# Patient Record
Sex: Male | Born: 1955 | Race: White | Hispanic: No | Smoking: Current every day smoker
Health system: Southern US, Community
[De-identification: ages and names within clinical notes are randomized; demographics above are authoritative.]

## PROBLEM LIST (undated history)

## (undated) DIAGNOSIS — F1910 Other psychoactive substance abuse, uncomplicated: Secondary | ICD-10-CM

## (undated) DIAGNOSIS — F1010 Alcohol abuse, uncomplicated: Secondary | ICD-10-CM

---

## 2004-08-16 ENCOUNTER — Emergency Department (HOSPITAL_COMMUNITY): Admission: EM | Admit: 2004-08-16 | Discharge: 2004-08-17 | Payer: Self-pay | Admitting: Emergency Medicine

## 2008-06-07 DIAGNOSIS — B182 Chronic viral hepatitis C: Secondary | ICD-10-CM | POA: Insufficient documentation

## 2012-08-24 ENCOUNTER — Emergency Department (HOSPITAL_COMMUNITY): Payer: Managed Care, Other (non HMO)

## 2012-08-24 ENCOUNTER — Encounter (HOSPITAL_COMMUNITY): Payer: Self-pay | Admitting: *Deleted

## 2012-08-24 ENCOUNTER — Emergency Department (HOSPITAL_COMMUNITY)
Admission: EM | Admit: 2012-08-24 | Discharge: 2012-08-25 | Disposition: A | Discharge status: Home / Self Care | Payer: Managed Care, Other (non HMO) | Attending: Emergency Medicine | Admitting: Emergency Medicine

## 2012-08-24 DIAGNOSIS — R1909 Other intra-abdominal and pelvic swelling, mass and lump: Secondary | ICD-10-CM

## 2012-08-24 DIAGNOSIS — M549 Dorsalgia, unspecified: Secondary | ICD-10-CM | POA: Insufficient documentation

## 2012-08-24 DIAGNOSIS — M1990 Unspecified osteoarthritis, unspecified site: Secondary | ICD-10-CM | POA: Insufficient documentation

## 2012-08-24 DIAGNOSIS — M519 Unspecified thoracic, thoracolumbar and lumbosacral intervertebral disc disorder: Secondary | ICD-10-CM | POA: Insufficient documentation

## 2012-08-24 DIAGNOSIS — M542 Cervicalgia: Secondary | ICD-10-CM | POA: Insufficient documentation

## 2012-08-24 DIAGNOSIS — F1910 Other psychoactive substance abuse, uncomplicated: Secondary | ICD-10-CM | POA: Insufficient documentation

## 2012-08-24 DIAGNOSIS — R1900 Intra-abdominal and pelvic swelling, mass and lump, unspecified site: Secondary | ICD-10-CM | POA: Insufficient documentation

## 2012-08-24 HISTORY — DX: Other psychoactive substance abuse, uncomplicated: F19.10

## 2012-08-24 HISTORY — DX: Alcohol abuse, uncomplicated: F10.10

## 2012-08-24 LAB — RAPID URINE DRUG SCREEN, HOSP PERFORMED
Amphetamines: NOT DETECTED
Barbiturates: NOT DETECTED
Benzodiazepines: NOT DETECTED
Cocaine: NOT DETECTED
Opiates: NOT DETECTED
Tetrahydrocannabinol: NOT DETECTED

## 2012-08-24 LAB — COMPREHENSIVE METABOLIC PANEL
ALT: 10 U/L (ref 0–53)
AST: 15 U/L (ref 0–37)
Albumin: 4 g/dL (ref 3.5–5.2)
Alkaline Phosphatase: 62 U/L (ref 39–117)
BUN: 8 mg/dL (ref 6–23)
CO2: 27 mEq/L (ref 19–32)
Calcium: 9.6 mg/dL (ref 8.4–10.5)
Chloride: 105 mEq/L (ref 96–112)
Creatinine, Ser: 0.77 mg/dL (ref 0.50–1.35)
GFR calc Af Amer: >90 mL/min (ref >90)
GFR calc non Af Amer: >90 mL/min (ref >90)
Glucose, Bld: 106 mg/dL — ABNORMAL HIGH (ref 70–99)
Potassium: 3.8 mEq/L (ref 3.5–5.1)
Sodium: 140 mEq/L (ref 135–145)
Total Bilirubin: 0.2 mg/dL — ABNORMAL LOW (ref 0.3–1.2)
Total Protein: 7 g/dL (ref 6.0–8.3)

## 2012-08-24 LAB — CBC WITH DIFFERENTIAL/PLATELET
Basophils Absolute: 0 10*3/uL (ref 0.0–0.1)
Basophils Relative: 0 % (ref 0–1)
Eosinophils Absolute: 0.1 10*3/uL (ref 0.0–0.7)
Eosinophils Relative: 1 % (ref 0–5)
HCT: 42.2 % (ref 39.0–52.0)
Hemoglobin: 14.6 g/dL (ref 13.0–17.0)
Lymphocytes Relative: 32 % (ref 12–46)
Lymphs Abs: 2.3 10*3/uL (ref 0.7–4.0)
MCH: 32.6 pg (ref 26.0–34.0)
MCHC: 34.6 g/dL (ref 30.0–36.0)
MCV: 94.2 fL (ref 78.0–100.0)
Monocytes Absolute: 0.5 10*3/uL (ref 0.1–1.0)
Monocytes Relative: 8 % (ref 3–12)
Neutro Abs: 4.2 10*3/uL (ref 1.7–7.7)
Neutrophils Relative %: 60 % (ref 43–77)
Platelets: 245 10*3/uL (ref 150–400)
RBC: 4.48 MIL/uL (ref 4.22–5.81)
RDW: 12.5 % (ref 11.5–15.5)
WBC: 7 10*3/uL (ref 4.0–10.5)

## 2012-08-24 LAB — URINALYSIS, ROUTINE W REFLEX MICROSCOPIC
Bilirubin Urine: NEGATIVE
Glucose, UA: NEGATIVE mg/dL
Hgb urine dipstick: NEGATIVE
Ketones, ur: NEGATIVE mg/dL
Leukocytes, UA: NEGATIVE
Nitrite: NEGATIVE
Protein, ur: NEGATIVE mg/dL
Specific Gravity, Urine: 1.018 (ref 1.005–1.030)
Urobilinogen, UA: 0.2 mg/dL (ref 0.0–1.0)
pH: 6 (ref 5.0–8.0)

## 2012-08-24 LAB — ETHANOL: Alcohol, Ethyl (B): <11 mg/dL (ref 0–11)

## 2012-08-24 MED ORDER — ONDANSETRON HCL 8 MG PO TABS
4.0000 mg | ORAL_TABLET | Freq: Three times a day (TID) | ORAL | Status: DC | PRN
  Administered 2012-08-24: 4 mg via ORAL
  Filled 2012-08-24: qty 1

## 2012-08-24 MED ORDER — LORAZEPAM 1 MG PO TABS: 1.0000 mg | ORAL_TABLET | Freq: Four times a day (QID) | ORAL | Status: DC | PRN

## 2012-08-24 MED ORDER — FOLIC ACID 1 MG PO TABS
1.0000 mg | ORAL_TABLET | Freq: Every day | ORAL | Status: DC
  Administered 2012-08-24: 1 mg via ORAL
  Filled 2012-08-24: qty 1

## 2012-08-24 MED ORDER — ADULT MULTIVITAMIN W/MINERALS CH
1.0000 | ORAL_TABLET | Freq: Every day | ORAL | Status: DC
  Administered 2012-08-24: 1 via ORAL
  Filled 2012-08-24: qty 1

## 2012-08-24 MED ORDER — IOHEXOL 300 MG/ML  SOLN
80.0000 mL | Freq: Once | INTRAMUSCULAR | Status: AC | PRN
  Administered 2012-08-24: 80 mL via INTRAVENOUS

## 2012-08-24 MED ORDER — THIAMINE HCL 100 MG/ML IJ SOLN: 100.0000 mg | Freq: Every day | INTRAMUSCULAR | Status: DC

## 2012-08-24 MED ORDER — ZOLPIDEM TARTRATE 5 MG PO TABS
5.0000 mg | ORAL_TABLET | Freq: Every evening | ORAL | Status: DC | PRN
  Administered 2012-08-24: 5 mg via ORAL
  Filled 2012-08-24: qty 1

## 2012-08-24 MED ORDER — LORAZEPAM 1 MG PO TABS
1.0000 mg | ORAL_TABLET | Freq: Three times a day (TID) | ORAL | Status: DC | PRN
  Administered 2012-08-24: 1 mg via ORAL
  Filled 2012-08-24: qty 1

## 2012-08-24 MED ORDER — VITAMIN B-1 100 MG PO TABS
100.0000 mg | ORAL_TABLET | Freq: Every day | ORAL | Status: DC
  Administered 2012-08-24: 100 mg via ORAL
  Filled 2012-08-24: qty 1

## 2012-08-24 MED ORDER — NICOTINE 21 MG/24HR TD PT24
21.0000 mg | MEDICATED_PATCH | Freq: Every day | TRANSDERMAL | Status: DC
  Administered 2012-08-24: 21 mg via TRANSDERMAL
  Filled 2012-08-24 (×2): qty 1

## 2012-08-24 MED ORDER — LORAZEPAM 2 MG/ML IJ SOLN: 1.0000 mg | Freq: Four times a day (QID) | INTRAMUSCULAR | Status: DC | PRN

## 2012-08-24 NOTE — ED Notes (Signed)
He is here for detox.  He last took percocet this am 3omg. He is also an alcoholic.  He wants back and neck pain help for his pain.  He al;so has a growth beside his testicle for one year.  Last alcohol last pm

## 2012-08-24 NOTE — BH Assessment (Addendum)
Assessment Note Pt was declined at [facility] by Donell Sievert due to lack of criteria for inpatient treatment and was encouraged to pursue outpatient treatment.  Will explore outpatient options with patient.    Derrick Warner is an 56 y.o. male who presents to [facility] for alcohol detox.  He reports he used to drink a lot of liquor, but was able to reduce his usage and started drinking beer.  In the last six months, his beer usage has increased to 6-8 beers daily.  He also reports that he felt a sharp pain in his back about a year ago which he was managing with tylenol and ibuprofen until his stool turned very dark and he discontinued them in exchange for oxycodone which he has been obtaining off the street.  He has never seen a doctor for this pain in his back.  He also reports that he has withdrawal symptoms when he is not taking the opiates, but still has significant pain in his back.  He denies any SI, HI, or psychosis, but does report some depression.    Axis I: Mood Disorder NOS and Alcohol Dependence Opioid Dependence Axis II: Deferred Axis III:  Past Medical History  Diagnosis Date  . Alcohol abuse   . Drug abuse    Axis IV: problems with access to health care services and problems with primary support group Axis V: 41-50 serious symptoms  Past Medical History:  Past Medical History  Diagnosis Date  . Alcohol abuse   . Drug abuse     History reviewed. No pertinent past surgical history.  Family History: No family history on file.  Social History:  reports that he has been smoking.  He does not have any smokeless tobacco history on file. He reports that he drinks alcohol. His drug history not on file.  Additional Social History:  Alcohol / Drug Use History of alcohol / drug use?: Yes Substance #1 Name of Substance 1: Beer 1 - Age of First Use: 17 1 - Amount (size/oz): 6-8  1 - Frequency: daily 1 - Duration: 6 mos 1 - Last Use / Amount: 08/24/12 0100 8 beers Substance #2 Name of  Substance 2: Oxycodone 2 - Age of First Use: 52 2 - Amount (size/oz): 75 2 - Frequency: daily 2 - Duration: 1 year 2 - Last Use / Amount: 08/24/12 20mg   CIWA: CIWA-Ar BP: 132/84 mmHg Pulse Rate: 54  Nausea and Vomiting: no nausea and no vomiting Tactile Disturbances: none Tremor: no tremor Auditory Disturbances: not present Paroxysmal Sweats: no sweat visible Visual Disturbances: not present Anxiety: no anxiety, at ease Headache, Fullness in Head: none present Agitation: normal activity Orientation and Clouding of Sensorium: oriented and can do serial additions CIWA-Ar Total: 0  COWS: Clinical Opiate Withdrawal Scale (COWS) Resting Pulse Rate: Pulse Rate 80 or below Sweating: No report of chills or flushing Restlessness: Able to sit still Pupil Size: Pupils possibly larger than normal for room light Bone or Joint Aches: Mild diffuse discomfort Runny Nose or Tearing: Not present GI Upset: No GI symptoms Tremor: No tremor Yawning: No yawning Anxiety or Irritability: None Gooseflesh Skin: Skin is smooth COWS Total Score: 2   Allergies: No Known Allergies  Home Medications:  (Not in a hospital admission)  OB/GYN Status:  No LMP for male patient.  General Assessment Data Location of Assessment: [facility] ED Living Arrangements: Other relatives (sister and brother in-law) Can pt return to current living arrangement?: Yes Admission Status: Voluntary Is patient capable of  signing voluntary admission?: Yes Transfer from: Acute Hospital Referral Source: Self/Family/Friend  Education Status Is patient currently in school?: No Highest grade of school patient has completed: College  Risk to self Suicidal Ideation: No-Not Currently/Within Last 6 Months Suicidal Intent: No Is patient at risk for suicide?: No Suicidal Plan?: No Access to Means: No What has been your use of drugs/alcohol within the last 12 months?: alcohol, opiates Previous Attempts/Gestures: No Intentional Self  Injurious Behavior: None Family Suicide History: No Recent stressful life event(s): Turmoil (Comment) (oldest son having physical and financial problems) Persecutory voices/beliefs?: No Depression: Yes Depression Symptoms: Guilt;Tearfulness;Isolating;Loss of interest in usual pleasures;Feeling worthless/self pity;Feeling angry/irritable;Fatigue;Insomnia Substance abuse history and/or treatment for substance abuse?: Yes Suicide prevention information given to non-admitted patients: Yes  Risk to Others Homicidal Ideation: No Thoughts of Harm to Others: No-Not Currently Present/Within Last 6 Months Current Homicidal Intent: No Current Homicidal Plan: No Access to Homicidal Means: No Identified Victim: no one in particular-gets angry in situations History of harm to others?: Yes Assessment of Violence: In distant past Violent Behavior Description: fighting while drinking Does patient have access to weapons?: No Criminal Charges Pending?: No Does patient have a court date: No  Psychosis Hallucinations: None noted Delusions: None noted  Mental Status Report Appear/Hygiene: Improved Eye Contact: Good Motor Activity: Freedom of movement Speech: Logical/coherent Level of Consciousness: Alert Mood: Depressed Affect: Appropriate to circumstance Anxiety Level: Minimal Thought Processes: Coherent;Relevant Judgement: Unimpaired Orientation: Person;Place;Time;Situation Obsessive Compulsive Thoughts/Behaviors: None  Cognitive Functioning Concentration: Decreased Memory: Recent Intact;Remote Intact IQ: Average Insight: Good Impulse Control: Fair Appetite: Fair Sleep: Decreased Total Hours of Sleep:  (very restless, wakeful) Vegetative Symptoms: None  ADLScreening [facility] Assessment Services) Patient's cognitive ability adequate to safely complete daily activities?: Yes Patient able to express need for assistance with ADLs?: Yes Independently performs ADLs?: Yes (appropriate for  developmental age)  Abuse/Neglect [facility]) Physical Abuse: Denies Verbal Abuse: Denies Sexual Abuse: Denies  Prior Inpatient Therapy Prior Inpatient Therapy: No  Prior Outpatient Therapy Prior Outpatient Therapy: Yes Prior Therapy Dates: 2008 Prior Therapy Facilty/Provider(s): [facility] Reason for Treatment: drinking liquor  ADL Screening (condition at time of admission) Patient's cognitive ability adequate to safely complete daily activities?: Yes Patient able to express need for assistance with ADLs?: Yes Independently performs ADLs?: Yes (appropriate for developmental age) Weakness of Legs: None Weakness of Arms/Hands: None       Abuse/Neglect Assessment (Assessment to be complete while patient is alone) Physical Abuse: Denies Verbal Abuse: Denies Sexual Abuse: Denies Exploitation of patient/patient's resources: Denies Self-Neglect: Denies     Merchant navy officer (For Healthcare) Advance Directive: Patient does not have advance directive Pre-existing out of facility DNR order (yellow form or pink MOST form): No Nutrition Screen- [facility] Adult/WL/AP Patient's home diet: Regular  Additional Information 1:1 In Past 12 Months?: No CIRT Risk: No Elopement Risk: No Does patient have medical clearance?: Yes     Disposition:  Disposition Disposition of Patient: Inpatient treatment program Type of inpatient treatment program: Adult  On Site Evaluation by:   Reviewed with Physician:     Steward Ros 08/24/2012 10:52 PM

## 2012-08-24 NOTE — ED Provider Notes (Signed)
History  This chart was scribed for Derrick Quarry, MD by Ladona Ridgel Day. This patient was seen in room TR10C/TR10C and the patient's care was started at 1504.   CSN: [id number]  Arrival date & time 08/24/12  1504   None     Chief Complaint  Patient presents with  . detox alcohol and drugs    Patient is a 56 y.o. male presenting with drug/alcohol assessment. The history is provided by the patient. No language interpreter was used.  Drug / Alcohol Assessment Primary symptoms include no loss of consciousness, no weakness and no self-injury. This is a chronic problem. The current episode started more than 1 week ago. The problem has not changed since onset.Suspected agents include alcohol and prescription drugs. Pertinent negatives include no fever, no nausea and no vomiting.   Derrick Warner is a 56 y.o. male who presents to the Emergency Department requesting detox. He states that he last took percocet about 12 hours ago and also struggling with alcoholism. He states his back and neck pain are also bothering him. He states has been abusing 75 mg oxycodone a day and 6-12 beers a day almost all of his life. He states has tried to quit alcohol before and had tremors from withdrawal. 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 He also complains of a mass on his right groin that has increased in size over the years.   Past Medical History  Diagnosis Date  . Alcohol abuse   . Drug abuse     History reviewed. No pertinent past surgical history.  No family history on file.  History  Substance Use Topics  . Smoking status: Current Everyday Smoker  . Smokeless tobacco: Not on file  . Alcohol Use: Yes      Review of Systems  Constitutional: Negative for fever and chills.  HENT: Positive for neck pain. Negative for congestion and rhinorrhea.   Respiratory: Negative for shortness of breath.   Gastrointestinal: Negative for nausea, vomiting and abdominal pain.  Musculoskeletal: Positive  for back pain.  Skin: Negative for color change.  Neurological: Negative for loss of consciousness and weakness.  Psychiatric/Behavioral: Negative for self-injury.       Abusing oxycodone and alcohol  All other systems reviewed and are negative.    Allergies  Review of patient's allergies indicates no known allergies.  Home Medications  No current outpatient prescriptions on file.  Triage Vitals: BP 139/83  Pulse 68  Temp 98.2 F (36.8 C) (Oral)  Resp 18  SpO2 100%  Physical Exam  Nursing note and vitals reviewed. Constitutional: He is oriented to person, place, and time. He appears well-developed and well-nourished. No distress.  HENT:  Head: Normocephalic and atraumatic.  Eyes: EOM are normal.  Neck: Neck supple. No tracheal deviation present.  Cardiovascular: Normal rate, regular rhythm and normal heart sounds.   Pulmonary/Chest: Effort normal. No respiratory distress.  Abdominal: Soft. Bowel sounds are normal. He exhibits no distension. There is no tenderness. There is no rebound and no guarding.  Genitourinary:       Non tender 4 x 10 cm mass right medial inguinal which is non-reducible.   Musculoskeletal: Normal range of motion.  Neurological: He is alert and oriented to person, place, and time.  Skin: Skin is warm and dry.  Psychiatric: He has a normal mood and affect. His behavior is normal.    ED Course  Procedures (including critical care time) DIAGNOSTIC STUDIES: Oxygen Saturation is 100% on room  air, normal by my interpretation.    COORDINATION OF CARE: At 500 PM Discussed treatment plan with patient which includes UA, blood work, and ETOH screen. Patient agrees.   Labs Reviewed  COMPREHENSIVE METABOLIC PANEL - Abnormal; Notable for the following:    Glucose, Bld 106 (*)     Total Bilirubin 0.2 (*)     All other components within normal limits  URINALYSIS, ROUTINE W REFLEX MICROSCOPIC  URINE RAPID DRUG SCREEN (HOSP PERFORMED)  CBC WITH DIFFERENTIAL    ETHANOL   No results found.   No diagnosis found. Dg Tibia/fibula Left  08/24/2012  *RADIOLOGY REPORT*  Clinical Data: Painful lump  LEFT TIBIA AND FIBULA - 2 VIEW  Comparison: None.  Findings: No acute fracture.  No dislocation.  Soft tissue swelling lateral to the head of the fibula is noted.  IMPRESSION: No acute bony pathology.  Soft tissue swelling over the fibular head is noted.   Original Report Authenticated By: Donavan Burnet, M.D.    Ct Pelvis W Contrast  08/24/2012  *RADIOLOGY REPORT*  Clinical Data:  Right inguinal mass.  CT PELVIS WITH CONTRAST  Technique:  Multidetector CT imaging of the pelvis was performed using the standard protocol following the bolus administration of intravenous contrast.  Contrast: 80mL OMNIPAQUE IOHEXOL 300 MG/ML  SOLN  Comparison:   None.  Findings:  A well circumscribed fatty mass measures 2.3 x 3.7 x 5.4 cm.  This is external to the inguinal canal and compatible with a benign lipoma.  The visualized kidneys and ureters are within normal limits. Urinary bladder is unremarkable.  The visualized bowel is within normal limits.  The appendix is visualized and normal.  No significant adenopathy or free fluid is present.  Chronic end plate degenerative changes are present on the right at L4-5.  A rightward disc herniation and osseous foraminal stenosis are present at L4-5 as well.  IMPRESSION:  1.  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 2.  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   Original Report Authenticated By: Jamesetta Orleans. MATTERN, M.D.      MDM     I personally performed the services described in this documentation, which was scribed in my presence. The recorded information has been reviewed and considered.   1- polysubstance abuse.  Patient to be evaluated by act 2- groin mass- c.w. Lipoma- advised follow up with surgeon for further evaluation after discharge.    Derrick Quarry, MD 08/24/12 [phone]

## 2012-08-25 NOTE — BHH Counselor (Signed)
Oneita Hurt, assessment counselor at [facility], submitted Pt for admission to [facility]. Akeysha McMurren, AC confirmed there is not an appropriate bed available at this time. Donell Sievert, PA reviewed Pt's clinical information and determined Pt does not meet inpatient criteria and recommends referrals to outpatient treatment. Communicated this recommendation to [facility].  Harlin Rain Patsy [place], LPC

## 2012-08-25 NOTE — ED Notes (Addendum)
Explained to pt policy for belongings/ verified pt belongings with Burna Forts EMT/ Stored pt wallet, black LG flip phone and cash $100 with security in secured location/ placed pt other belongings in locker 7 and red/black duffle is on top of the grey lockers.

## 2012-08-25 NOTE — ED Notes (Signed)
Provided pt with medical records number per pt request.

## 2018-11-11 ENCOUNTER — Ambulatory Visit
Admission: RE | Admit: 2018-11-11 | Discharge: 2018-11-11 | Disposition: A | Discharge status: Home / Self Care | Payer: Disability Insurance | Source: Ambulatory Visit | Attending: Family Medicine | Admitting: Family Medicine

## 2018-11-25 ENCOUNTER — Ambulatory Visit
Admission: RE | Admit: 2018-11-25 | Discharge: 2018-11-25 | Disposition: A | Discharge status: Home / Self Care | Payer: Disability Insurance | Source: Ambulatory Visit | Attending: *Deleted | Admitting: *Deleted

## 2018-11-25 ENCOUNTER — Other Ambulatory Visit: Payer: Self-pay | Admitting: *Deleted

## 2018-11-25 ENCOUNTER — Ambulatory Visit
Admission: RE | Admit: 2018-11-25 | Discharge: 2018-11-25 | Disposition: A | Discharge status: Home / Self Care | Payer: Disability Insurance | Source: Ambulatory Visit | Attending: Pediatrics | Admitting: Pediatrics

## 2018-11-25 DIAGNOSIS — M79601 Pain in right arm: Secondary | ICD-10-CM

## 2018-11-25 DIAGNOSIS — M549 Dorsalgia, unspecified: Secondary | ICD-10-CM

## 2020-01-27 ENCOUNTER — Telehealth: Payer: Self-pay | Admitting: *Deleted

## 2020-01-30 NOTE — Telephone Encounter (Signed)
Error note

## 2020-11-05 DIAGNOSIS — Z125 Encounter for screening for malignant neoplasm of prostate: Secondary | ICD-10-CM | POA: Diagnosis not present

## 2020-11-05 DIAGNOSIS — Z0000 Encounter for general adult medical examination without abnormal findings: Secondary | ICD-10-CM | POA: Diagnosis not present

## 2020-11-05 DIAGNOSIS — Z1211 Encounter for screening for malignant neoplasm of colon: Secondary | ICD-10-CM | POA: Diagnosis not present

## 2020-11-05 DIAGNOSIS — Z720 Tobacco use: Secondary | ICD-10-CM | POA: Diagnosis not present

## 2020-11-05 DIAGNOSIS — R0600 Dyspnea, unspecified: Secondary | ICD-10-CM | POA: Diagnosis not present

## 2020-12-20 DIAGNOSIS — Z720 Tobacco use: Secondary | ICD-10-CM | POA: Diagnosis not present

## 2020-12-20 DIAGNOSIS — Z1211 Encounter for screening for malignant neoplasm of colon: Secondary | ICD-10-CM | POA: Diagnosis not present

## 2020-12-20 DIAGNOSIS — E782 Mixed hyperlipidemia: Secondary | ICD-10-CM | POA: Diagnosis not present

## 2020-12-20 DIAGNOSIS — E118 Type 2 diabetes mellitus with unspecified complications: Secondary | ICD-10-CM | POA: Diagnosis not present

## 2021-02-27 ENCOUNTER — Other Ambulatory Visit: Payer: Self-pay | Admitting: Gastroenterology

## 2021-02-27 DIAGNOSIS — F1011 Alcohol abuse, in remission: Secondary | ICD-10-CM | POA: Diagnosis not present

## 2021-02-27 DIAGNOSIS — Z8619 Personal history of other infectious and parasitic diseases: Secondary | ICD-10-CM | POA: Diagnosis not present

## 2021-02-27 DIAGNOSIS — Z1211 Encounter for screening for malignant neoplasm of colon: Secondary | ICD-10-CM | POA: Diagnosis not present

## 2021-03-01 ENCOUNTER — Other Ambulatory Visit: Payer: Disability Insurance

## 2021-03-20 ENCOUNTER — Other Ambulatory Visit: Payer: Self-pay | Admitting: Gastroenterology

## 2021-03-20 DIAGNOSIS — Z8619 Personal history of other infectious and parasitic diseases: Secondary | ICD-10-CM

## 2021-03-20 DIAGNOSIS — F1011 Alcohol abuse, in remission: Secondary | ICD-10-CM

## 2021-03-22 DIAGNOSIS — E118 Type 2 diabetes mellitus with unspecified complications: Secondary | ICD-10-CM | POA: Diagnosis not present

## 2021-03-22 DIAGNOSIS — E782 Mixed hyperlipidemia: Secondary | ICD-10-CM | POA: Diagnosis not present

## 2021-03-22 DIAGNOSIS — I10 Essential (primary) hypertension: Secondary | ICD-10-CM | POA: Diagnosis not present

## 2021-03-25 ENCOUNTER — Ambulatory Visit
Admission: RE | Admit: 2021-03-25 | Discharge: 2021-03-25 | Disposition: A | Discharge status: Home / Self Care | Payer: Medicare Other | Source: Ambulatory Visit | Attending: Gastroenterology | Admitting: Gastroenterology

## 2021-03-25 DIAGNOSIS — Z8619 Personal history of other infectious and parasitic diseases: Secondary | ICD-10-CM

## 2021-03-25 DIAGNOSIS — F1011 Alcohol abuse, in remission: Secondary | ICD-10-CM

## 2021-04-03 DIAGNOSIS — R229 Localized swelling, mass and lump, unspecified: Secondary | ICD-10-CM | POA: Diagnosis not present

## 2021-05-08 DIAGNOSIS — E119 Type 2 diabetes mellitus without complications: Secondary | ICD-10-CM | POA: Diagnosis not present

## 2021-05-30 ENCOUNTER — Other Ambulatory Visit: Payer: Self-pay | Admitting: Surgery

## 2021-05-30 DIAGNOSIS — R229 Localized swelling, mass and lump, unspecified: Secondary | ICD-10-CM

## 2021-06-14 ENCOUNTER — Ambulatory Visit
Admission: RE | Admit: 2021-06-14 | Discharge: 2021-06-14 | Disposition: A | Discharge status: Home / Self Care | Payer: Medicare Other | Source: Ambulatory Visit | Attending: Surgery | Admitting: Surgery

## 2021-06-14 ENCOUNTER — Other Ambulatory Visit: Payer: Self-pay

## 2021-06-14 DIAGNOSIS — K5730 Diverticulosis of large intestine without perforation or abscess without bleeding: Secondary | ICD-10-CM | POA: Diagnosis not present

## 2021-06-14 DIAGNOSIS — R229 Localized swelling, mass and lump, unspecified: Secondary | ICD-10-CM

## 2021-06-14 DIAGNOSIS — R590 Localized enlarged lymph nodes: Secondary | ICD-10-CM | POA: Diagnosis not present

## 2021-06-14 DIAGNOSIS — I700 Atherosclerosis of aorta: Secondary | ICD-10-CM | POA: Diagnosis not present

## 2021-06-14 DIAGNOSIS — K760 Fatty (change of) liver, not elsewhere classified: Secondary | ICD-10-CM | POA: Diagnosis not present

## 2021-06-14 MED ORDER — IOPAMIDOL (ISOVUE-300) INJECTION 61%
100.0000 mL | Freq: Once | INTRAVENOUS | Status: AC | PRN
  Administered 2021-06-14: 100 mL via INTRAVENOUS

## 2021-06-21 DIAGNOSIS — I10 Essential (primary) hypertension: Secondary | ICD-10-CM | POA: Diagnosis not present

## 2021-06-21 DIAGNOSIS — E118 Type 2 diabetes mellitus with unspecified complications: Secondary | ICD-10-CM | POA: Diagnosis not present

## 2021-06-21 DIAGNOSIS — E782 Mixed hyperlipidemia: Secondary | ICD-10-CM | POA: Diagnosis not present

## 2021-09-25 DIAGNOSIS — I10 Essential (primary) hypertension: Secondary | ICD-10-CM | POA: Diagnosis not present

## 2021-09-25 DIAGNOSIS — E782 Mixed hyperlipidemia: Secondary | ICD-10-CM | POA: Diagnosis not present

## 2021-09-25 DIAGNOSIS — E118 Type 2 diabetes mellitus with unspecified complications: Secondary | ICD-10-CM | POA: Diagnosis not present

## 2021-09-25 DIAGNOSIS — Z720 Tobacco use: Secondary | ICD-10-CM | POA: Diagnosis not present

## 2021-09-25 DIAGNOSIS — Z0184 Encounter for antibody response examination: Secondary | ICD-10-CM | POA: Diagnosis not present

## 2021-10-02 DIAGNOSIS — D1723 Benign lipomatous neoplasm of skin and subcutaneous tissue of right leg: Secondary | ICD-10-CM | POA: Diagnosis not present

## 2021-10-14 ENCOUNTER — Other Ambulatory Visit: Payer: Self-pay | Admitting: *Deleted

## 2021-10-14 DIAGNOSIS — Z87891 Personal history of nicotine dependence: Secondary | ICD-10-CM

## 2021-10-14 DIAGNOSIS — F17210 Nicotine dependence, cigarettes, uncomplicated: Secondary | ICD-10-CM

## 2021-10-21 DIAGNOSIS — D171 Benign lipomatous neoplasm of skin and subcutaneous tissue of trunk: Secondary | ICD-10-CM | POA: Diagnosis not present

## 2021-10-21 DIAGNOSIS — M7989 Other specified soft tissue disorders: Secondary | ICD-10-CM | POA: Diagnosis not present

## 2021-10-29 ENCOUNTER — Ambulatory Visit (INDEPENDENT_AMBULATORY_CARE_PROVIDER_SITE_OTHER): Payer: Medicare Other | Admitting: Acute Care

## 2021-10-29 ENCOUNTER — Ambulatory Visit
Admission: RE | Admit: 2021-10-29 | Discharge: 2021-10-29 | Disposition: A | Discharge status: Home / Self Care | Payer: Medicare Other | Source: Ambulatory Visit | Attending: Acute Care | Admitting: Acute Care

## 2021-10-29 ENCOUNTER — Encounter: Payer: Self-pay | Admitting: Acute Care

## 2021-10-29 DIAGNOSIS — F17210 Nicotine dependence, cigarettes, uncomplicated: Secondary | ICD-10-CM

## 2021-10-29 DIAGNOSIS — Z87891 Personal history of nicotine dependence: Secondary | ICD-10-CM

## 2021-10-29 NOTE — Progress Notes (Signed)
Virtual Visit via Telephone Note  I connected with Derrick Warner on 10/29/21 at  2:00 PM EST by telephone and verified that I am speaking with the correct person using two identifiers.  Location: Patient: Home Provider: [place] from home    I discussed the limitations, risks, security and privacy concerns of performing an evaluation and management service by telephone and the availability of in person appointments. I also discussed with the patient that there may be a patient responsible charge related to this service. The patient expressed understanding and agreed to proceed.  Shared Decision Making Visit Lung Cancer Screening Program [phone])   Eligibility: Age 65 y.o. Pack Years Smoking History Calculation 47 (# packs/per year x # years smoked) Recent History of coughing up blood  no Unexplained weight loss? no ( >Than 15 pounds within the last 6 months ) Prior History Lung / other cancer no (Diagnosis within the last 5 years already requiring surveillance chest CT Scans). Smoking Status Current Smoker Former Smokers: Years since quit: NA  Quit Date: NA  Visit Components: Discussion included one or more decision making aids. yes Discussion included risk/benefits of screening. yes Discussion included potential follow up diagnostic testing for abnormal scans. yes Discussion included meaning and risk of over diagnosis. yes Discussion included meaning and risk of False Positives. yes Discussion included meaning of total radiation exposure. yes  Counseling Included: Importance of adherence to annual lung cancer LDCT screening. yes Impact of comorbidities on ability to participate in the program. yes Ability and willingness to under diagnostic treatment. yes  Smoking Cessation Counseling: Current Smokers:  Discussed importance of smoking cessation. yes Information about tobacco cessation classes and interventions provided to patient. yes Patient provided with "ticket" for LDCT  Scan. yes Symptomatic Patient. yes  Counseling(Intermediate counseling: > three minutes) 99406 Diagnosis Code: Tobacco Use Z72.0 Asymptomatic Patient NA  Counseling NA Former Smokers:  Discussed the importance of maintaining cigarette abstinence. yes Diagnosis Code: Personal History of Nicotine Dependence. F63.846 Information about tobacco cessation classes and interventions provided to patient. Yes Patient provided with "ticket" for LDCT Scan. yes Written Order for Lung Cancer Screening with LDCT placed in Epic. Yes (CT Chest Lung Cancer Screening Low Dose W/O CM) [id number] Z12.2-Screening of respiratory organs Z87.891-Personal history of nicotine dependence   I spent 25 minutes of face to face time with him discussing the risks and benefits of lung cancer screening. We viewed a power point together that explained in detail the above noted topics. We took the time to pause the power point at intervals to allow for questions to be asked and answered to ensure understanding. We discussed that he had taken the single most powerful action possible to decrease his risk of developing lung cancer when he quit smoking. I counseled him to remain smoke free, and to contact me if he ever had the desire to smoke again so that I can provide resources and tools to help support the effort to remain smoke free. We discussed the time and location of the scan, and that either  Abigail Miyamoto RN or I will call with the results within  24-48 hours of receiving them. He has my card and contact information in the event he needs to speak with me, in addition to a copy of the power point we reviewed as a resource. He verbalized understanding of all of the above and had no further questions upon leaving the office.     I explained to the patient that there has been a  high incidence of coronary artery disease noted on these exams. I explained that this is a non-gated exam therefore degree or severity cannot be determined.  This patient is not on statin therapy. I have asked the patient to follow-up with their PCP regarding any incidental finding of coronary artery disease and management with diet or medication as they feel is clinically indicated. The patient verbalized understanding of the above and had no further questions.  Baylyn Sickles D. Tiburcio Pea, NP-C [facility] Personal contact information can be found on Amion  10/29/2021, 1:54 PM

## 2021-10-30 ENCOUNTER — Telehealth: Payer: Self-pay | Admitting: Acute Care

## 2021-10-30 ENCOUNTER — Other Ambulatory Visit: Payer: Self-pay | Admitting: Acute Care

## 2021-10-30 DIAGNOSIS — Z87891 Personal history of nicotine dependence: Secondary | ICD-10-CM

## 2021-10-30 DIAGNOSIS — F17210 Nicotine dependence, cigarettes, uncomplicated: Secondary | ICD-10-CM

## 2021-10-30 NOTE — Telephone Encounter (Signed)
Noted. Will route to Lung Cancer Screening Team

## 2021-10-31 NOTE — Telephone Encounter (Signed)
CT order placed and report routed to PCP

## 2021-11-29 DIAGNOSIS — Z136 Encounter for screening for cardiovascular disorders: Secondary | ICD-10-CM | POA: Diagnosis not present

## 2021-11-29 DIAGNOSIS — Z0000 Encounter for general adult medical examination without abnormal findings: Secondary | ICD-10-CM | POA: Diagnosis not present

## 2021-11-29 DIAGNOSIS — E118 Type 2 diabetes mellitus with unspecified complications: Secondary | ICD-10-CM | POA: Diagnosis not present

## 2021-11-29 DIAGNOSIS — Z1389 Encounter for screening for other disorder: Secondary | ICD-10-CM | POA: Diagnosis not present

## 2021-12-02 ENCOUNTER — Other Ambulatory Visit: Payer: Self-pay | Admitting: Family Medicine

## 2021-12-02 DIAGNOSIS — Z136 Encounter for screening for cardiovascular disorders: Secondary | ICD-10-CM

## 2021-12-18 ENCOUNTER — Ambulatory Visit: Payer: Medicare Other | Admitting: Podiatry

## 2021-12-18 ENCOUNTER — Other Ambulatory Visit: Payer: Self-pay

## 2021-12-18 ENCOUNTER — Encounter: Payer: Self-pay | Admitting: Podiatry

## 2021-12-18 DIAGNOSIS — M79675 Pain in left toe(s): Secondary | ICD-10-CM | POA: Diagnosis not present

## 2021-12-18 DIAGNOSIS — E1142 Type 2 diabetes mellitus with diabetic polyneuropathy: Secondary | ICD-10-CM

## 2021-12-18 DIAGNOSIS — B351 Tinea unguium: Secondary | ICD-10-CM

## 2021-12-18 DIAGNOSIS — M79674 Pain in right toe(s): Secondary | ICD-10-CM

## 2021-12-18 NOTE — Progress Notes (Signed)
°  Subjective:  Patient ID: Derrick Warner, male    DOB: 13-May-1956,   MRN: [id number]  Chief Complaint  Patient presents with   Nail Problem    Diabetic nail trim    66 y.o. male presents for diabetic nail care and concern for thickened painful and elongated tonails. Patient relates he is unable to trim himself. He relates burning and tingling in his feet. States his last A1c was 6.3.. Denies any other pedal complaints. Denies n/v/f/c.   PCP: Caren Macadam, MD   Past Medical History:  Diagnosis Date   Alcohol abuse    Drug abuse ([place])     Objective:  Physical Exam: Vascular: DP/PT pulses 2/4 bilateral. CFT <3 seconds. Normal hair growth on digits. No edema.  Skin. No lacerations or abrasions bilateral feet. Nails 1-5 are thickened discolored and elongated with subungual debris.  Xerosis noted to bilateral plantar feet.  Musculoskeletal: MMT 5/5 bilateral lower extremities in DF, PF, Inversion and Eversion. Deceased ROM in DF of ankle joint.  Neurological: Sensation intact to light touch.   Assessment:   1. Type 2 diabetes mellitus with diabetic polyneuropathy, unspecified whether long term insulin use (HCC)   2. Pain due to onychomycosis of toenails of both feet      Plan:  Patient was evaluated and treated and all questions answered. -Discussed and educated patient on diabetic foot care, especially with  regards to the vascular, neurological and musculoskeletal systems.  -Stressed the importance of good glycemic control and the detriment of not  controlling glucose levels in relation to the foot. -Discussed supportive shoes at all times and checking feet regularly.  -Mechanically debrided all nails 1-5 bilateral using sterile nail nipper and filed with dremel without incident  -Answered all patient questions -Patient to return  in 3 months for at risk foot care -Patient advised to call the office if any problems or questions arise in the meantime.   Lorenda Peck, DPM

## 2021-12-24 ENCOUNTER — Ambulatory Visit
Admission: RE | Admit: 2021-12-24 | Discharge: 2021-12-24 | Disposition: A | Discharge status: Home / Self Care | Payer: Medicare Other | Source: Ambulatory Visit | Attending: Family Medicine | Admitting: Family Medicine

## 2021-12-24 DIAGNOSIS — Z136 Encounter for screening for cardiovascular disorders: Secondary | ICD-10-CM

## 2022-01-31 ENCOUNTER — Other Ambulatory Visit: Payer: Self-pay

## 2022-01-31 ENCOUNTER — Ambulatory Visit (INDEPENDENT_AMBULATORY_CARE_PROVIDER_SITE_OTHER): Payer: Self-pay

## 2022-01-31 DIAGNOSIS — B351 Tinea unguium: Secondary | ICD-10-CM

## 2022-01-31 DIAGNOSIS — M79675 Pain in left toe(s): Secondary | ICD-10-CM

## 2022-01-31 DIAGNOSIS — M79674 Pain in right toe(s): Secondary | ICD-10-CM

## 2022-01-31 NOTE — Progress Notes (Signed)
Patient presents today for the 1st laser treatment. Diagnosed with mycotic nail infection by Dr. Ralene Cork.   Toenail most affected bilateral hallux.  All other systems are negative.  Nails were filed thin. Laser therapy was administered to bilateral 1-5 toenails  and patient tolerated the treatment well. All safety precautions were in place.    Follow up in 4 weeks for laser # 2.

## 2022-01-31 NOTE — Patient Instructions (Signed)

## 2022-03-04 DIAGNOSIS — E782 Mixed hyperlipidemia: Secondary | ICD-10-CM | POA: Diagnosis not present

## 2022-03-04 DIAGNOSIS — I10 Essential (primary) hypertension: Secondary | ICD-10-CM | POA: Diagnosis not present

## 2022-03-04 DIAGNOSIS — E118 Type 2 diabetes mellitus with unspecified complications: Secondary | ICD-10-CM | POA: Diagnosis not present

## 2022-03-04 DIAGNOSIS — F1011 Alcohol abuse, in remission: Secondary | ICD-10-CM | POA: Diagnosis not present

## 2022-03-07 ENCOUNTER — Other Ambulatory Visit: Payer: Medicare Other

## 2022-03-14 ENCOUNTER — Ambulatory Visit (INDEPENDENT_AMBULATORY_CARE_PROVIDER_SITE_OTHER): Payer: Self-pay

## 2022-03-14 DIAGNOSIS — B351 Tinea unguium: Secondary | ICD-10-CM

## 2022-03-14 NOTE — Progress Notes (Signed)
Patient presents today for the 2nd laser treatment. Diagnosed with mycotic nail infection by Dr. Ralene Cork.  ? ?Toenail most affected bilateral hallux. ? ?All other systems are negative. ? ?Nails were filed thin. Laser therapy was administered to bilateral 1-5 toenails  and patient tolerated the treatment well. All safety precautions were in place.  ? ? ?Follow up in 4 weeks for laser # 3. ? ?

## 2022-03-17 DIAGNOSIS — Z5181 Encounter for therapeutic drug level monitoring: Secondary | ICD-10-CM | POA: Diagnosis not present

## 2022-03-17 DIAGNOSIS — F1120 Opioid dependence, uncomplicated: Secondary | ICD-10-CM | POA: Diagnosis not present

## 2022-03-17 DIAGNOSIS — Z79899 Other long term (current) drug therapy: Secondary | ICD-10-CM | POA: Diagnosis not present

## 2022-03-24 ENCOUNTER — Encounter: Payer: Self-pay | Admitting: Podiatry

## 2022-03-24 ENCOUNTER — Ambulatory Visit (INDEPENDENT_AMBULATORY_CARE_PROVIDER_SITE_OTHER): Payer: Medicare Other | Admitting: Podiatry

## 2022-03-24 DIAGNOSIS — F1011 Alcohol abuse, in remission: Secondary | ICD-10-CM | POA: Insufficient documentation

## 2022-03-24 DIAGNOSIS — M79674 Pain in right toe(s): Secondary | ICD-10-CM | POA: Diagnosis not present

## 2022-03-24 DIAGNOSIS — G720 Drug-induced myopathy: Secondary | ICD-10-CM | POA: Insufficient documentation

## 2022-03-24 DIAGNOSIS — M79675 Pain in left toe(s): Secondary | ICD-10-CM | POA: Diagnosis not present

## 2022-03-24 DIAGNOSIS — J439 Emphysema, unspecified: Secondary | ICD-10-CM | POA: Insufficient documentation

## 2022-03-24 DIAGNOSIS — B351 Tinea unguium: Secondary | ICD-10-CM | POA: Diagnosis not present

## 2022-03-24 DIAGNOSIS — E1142 Type 2 diabetes mellitus with diabetic polyneuropathy: Secondary | ICD-10-CM

## 2022-03-24 DIAGNOSIS — E782 Mixed hyperlipidemia: Secondary | ICD-10-CM | POA: Insufficient documentation

## 2022-03-24 DIAGNOSIS — I700 Atherosclerosis of aorta: Secondary | ICD-10-CM | POA: Insufficient documentation

## 2022-03-24 DIAGNOSIS — E118 Type 2 diabetes mellitus with unspecified complications: Secondary | ICD-10-CM | POA: Insufficient documentation

## 2022-03-24 DIAGNOSIS — I10 Essential (primary) hypertension: Secondary | ICD-10-CM | POA: Insufficient documentation

## 2022-03-24 NOTE — Progress Notes (Signed)
This patient presents to the office and does not know why he was reappointed.  He says he was diagnosed with fungal nails and is now being treated by the laser to clear his nail.  He says he has had 2 sessions with questional improvement.  He says he is diabetic. ? ?General Appearance  Alert, conversant and in no acute stress. ? ?Vascular  Dorsalis pedis and posterior tibial  pulses are palpable  bilaterally.  Capillary return is within normal limits  bilaterally. Temperature is within normal limits  bilaterally. ? ?Neurologic  Senn-Weinstein monofilament wire test within normal limits  bilaterally. Muscle power within normal limits bilaterally. ? ?Nails Thick disfigured discolored nails with subungual debris  from hallux to fifth toes bilaterally. No evidence of bacterial infection or drainage bilaterally. ? ?Orthopedic  No limitations of motion  feet .  No crepitus or effusions noted.  No bony pathology or digital deformities noted. ? ?Skin  normotropic skin with no porokeratosis noted bilaterally.  No signs of infections or ulcers noted.    ? ?Onychomycosis  B/l. ? ?Debride hallux toenails with nail nipper and dremel tool.  Told to return for laser treatment as scheduled.  RTC prn ? ? ?Helane Gunther DPM  ? ? ?

## 2022-04-14 DIAGNOSIS — F1120 Opioid dependence, uncomplicated: Secondary | ICD-10-CM | POA: Diagnosis not present

## 2022-04-18 ENCOUNTER — Ambulatory Visit (INDEPENDENT_AMBULATORY_CARE_PROVIDER_SITE_OTHER): Payer: Self-pay

## 2022-04-18 DIAGNOSIS — B351 Tinea unguium: Secondary | ICD-10-CM

## 2022-04-18 NOTE — Progress Notes (Signed)
Patient presents today for the 3rd laser treatment. Diagnosed with mycotic nail infection by Dr. Blenda Mounts.  ? ?Toenail most affected bilateral hallux. ? ?All other systems are negative. ? ?Nails were filed thin. Laser therapy was administered to bilateral 1-5 toenails  and patient tolerated the treatment well. All safety precautions were in place.  ? ? ?Follow up in 6 weeks for laser # 4. ? ?

## 2022-04-28 DIAGNOSIS — F1120 Opioid dependence, uncomplicated: Secondary | ICD-10-CM | POA: Diagnosis not present

## 2022-05-13 DIAGNOSIS — Z5181 Encounter for therapeutic drug level monitoring: Secondary | ICD-10-CM | POA: Diagnosis not present

## 2022-05-13 DIAGNOSIS — Z79899 Other long term (current) drug therapy: Secondary | ICD-10-CM | POA: Diagnosis not present

## 2022-05-13 DIAGNOSIS — F1120 Opioid dependence, uncomplicated: Secondary | ICD-10-CM | POA: Diagnosis not present

## 2022-05-22 DIAGNOSIS — Z5181 Encounter for therapeutic drug level monitoring: Secondary | ICD-10-CM | POA: Diagnosis not present

## 2022-05-22 DIAGNOSIS — Z79899 Other long term (current) drug therapy: Secondary | ICD-10-CM | POA: Diagnosis not present

## 2022-05-22 DIAGNOSIS — F1120 Opioid dependence, uncomplicated: Secondary | ICD-10-CM | POA: Diagnosis not present

## 2022-05-26 DIAGNOSIS — Z5181 Encounter for therapeutic drug level monitoring: Secondary | ICD-10-CM | POA: Diagnosis not present

## 2022-05-26 DIAGNOSIS — Z79899 Other long term (current) drug therapy: Secondary | ICD-10-CM | POA: Diagnosis not present

## 2022-05-26 DIAGNOSIS — F1120 Opioid dependence, uncomplicated: Secondary | ICD-10-CM | POA: Diagnosis not present

## 2022-05-30 ENCOUNTER — Ambulatory Visit (INDEPENDENT_AMBULATORY_CARE_PROVIDER_SITE_OTHER): Payer: Self-pay

## 2022-05-30 DIAGNOSIS — B351 Tinea unguium: Secondary | ICD-10-CM

## 2022-05-30 NOTE — Progress Notes (Signed)
Patient presents today for the 4th laser treatment. Diagnosed with mycotic nail infection by Dr. Ralene Cork.   Toenail most affected bilateral hallux.  All other systems are negative.  Nails were filed thin. Laser therapy was administered to bilateral 1-5 toenails  and patient tolerated the treatment well. All safety precautions were in place.    Follow up in 6 weeks for laser # 5.

## 2022-06-06 DIAGNOSIS — E118 Type 2 diabetes mellitus with unspecified complications: Secondary | ICD-10-CM | POA: Diagnosis not present

## 2022-06-06 DIAGNOSIS — E782 Mixed hyperlipidemia: Secondary | ICD-10-CM | POA: Diagnosis not present

## 2022-06-06 DIAGNOSIS — Z720 Tobacco use: Secondary | ICD-10-CM | POA: Diagnosis not present

## 2022-06-06 DIAGNOSIS — I700 Atherosclerosis of aorta: Secondary | ICD-10-CM | POA: Diagnosis not present

## 2022-06-15 IMAGING — CT CT CHEST LUNG CANCER SCREENING LOW DOSE W/O CM
2 of 5 series · 15 of 40 positions shown, 18 images · non-contrast
Comparison: None.

CLINICAL DATA: Lung cancer screening. Forty-seven pack-year
history. Current asymptomatic smoker.

EXAM:
CT CHEST WITHOUT CONTRAST LOW-DOSE FOR LUNG CANCER SCREENING
TECHNIQUE: Multidetector CT imaging of the chest was performed following the
standard protocol without IV contrast.

[Series 4: lung 1.00 br44 cor · coronal · 0.77mm/px · 3 of 488 slices shown]
[im 98/488  lung]
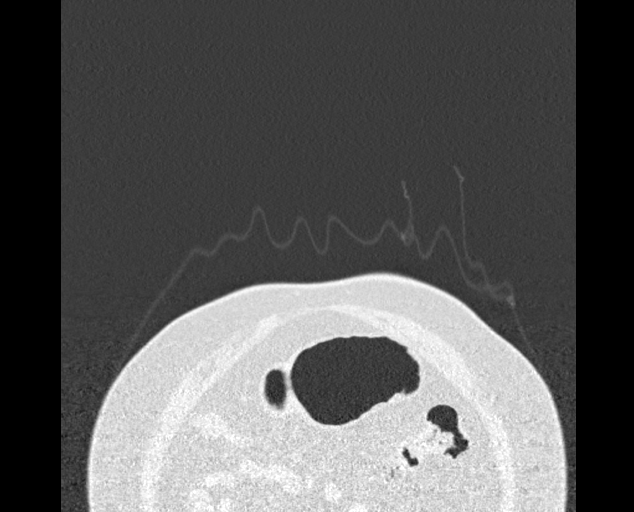
[im 195/488  lung]
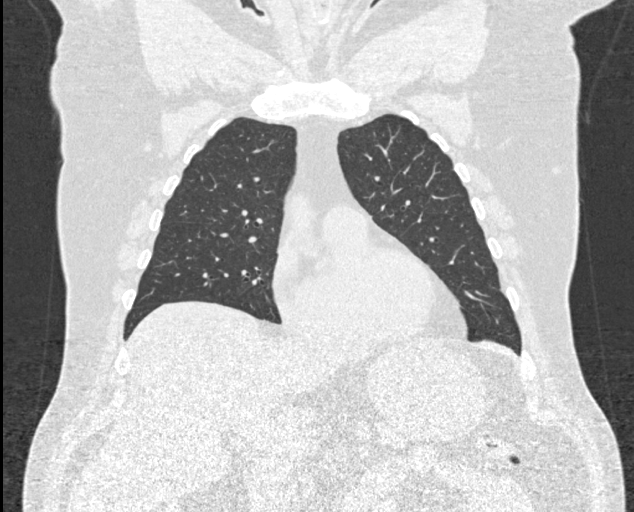
[im 293/488  lung]
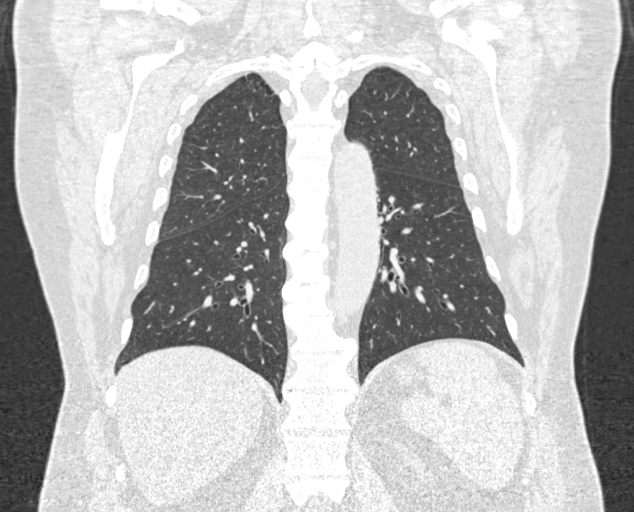

[Series 9: lung 1.00 br60 axial · axial · 0.96mm/px · z∈[-1300,-942]mm · 12 of 394 slices shown, 15 images]
[im 18/394  mediastinal]
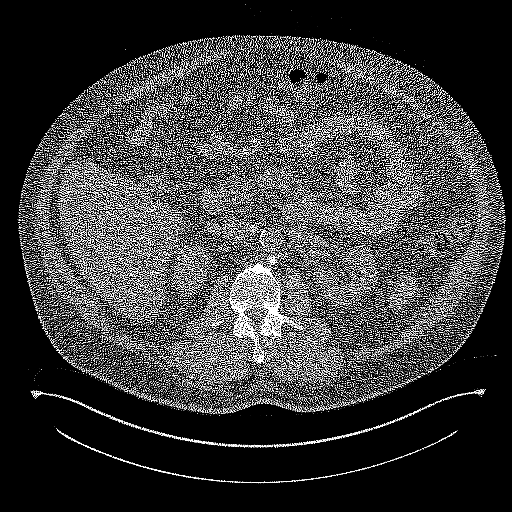
[im 18/394  lung]
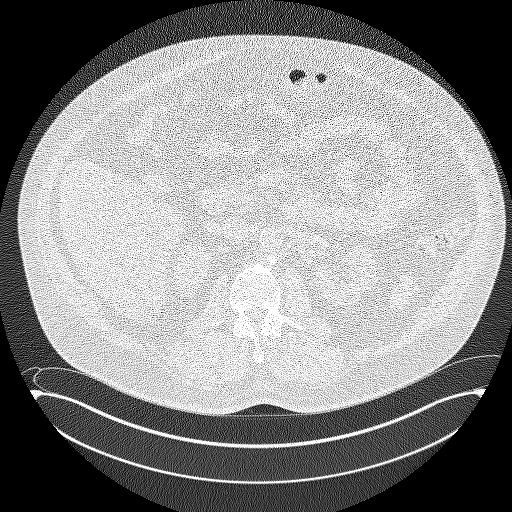
[im 54/394  lung]
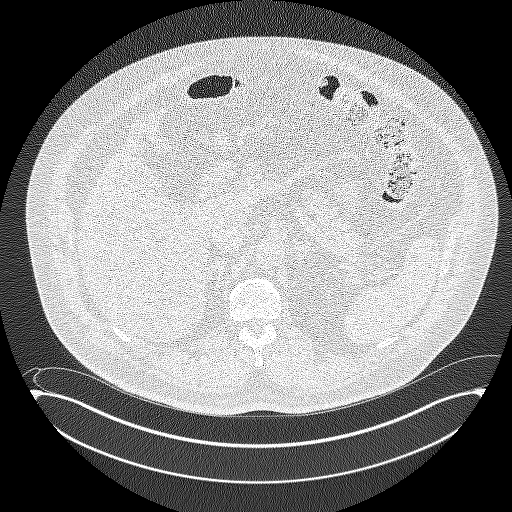
[im 90/394  lung]
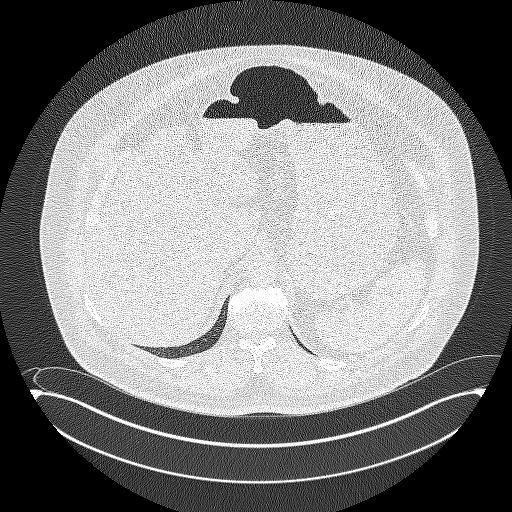
[im 126/394  lung]
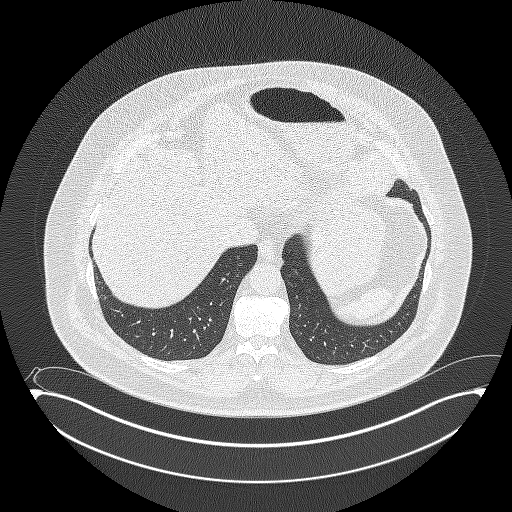
[im 143/394  mediastinal]
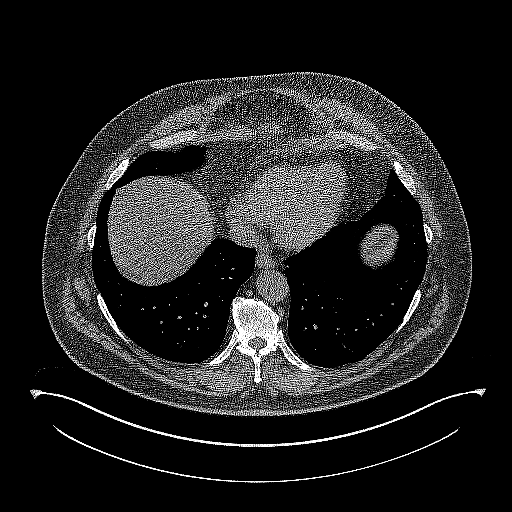
[im 143/394  lung]
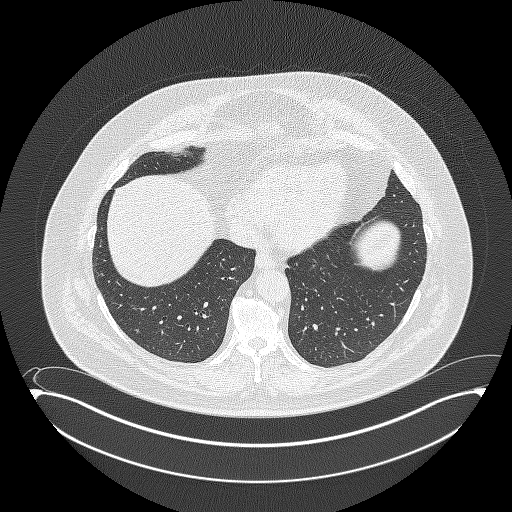
[im 179/394  lung]
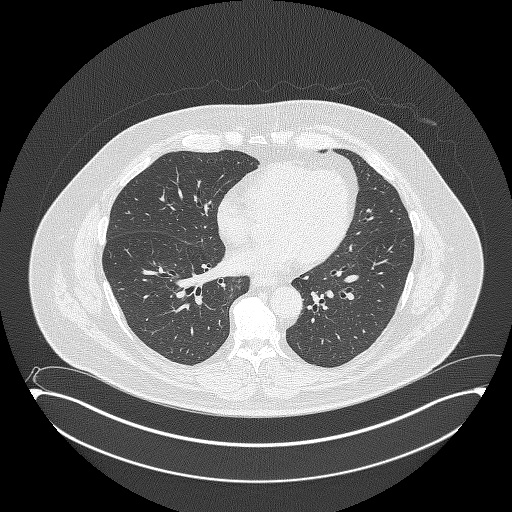
[im 215/394  lung]
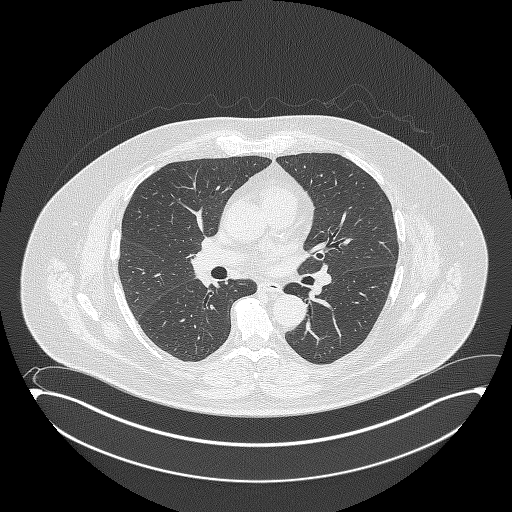
[im 251/394  lung]
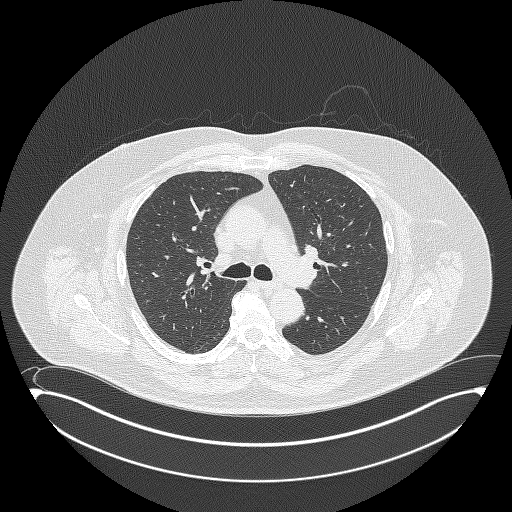
[im 268/394  mediastinal]
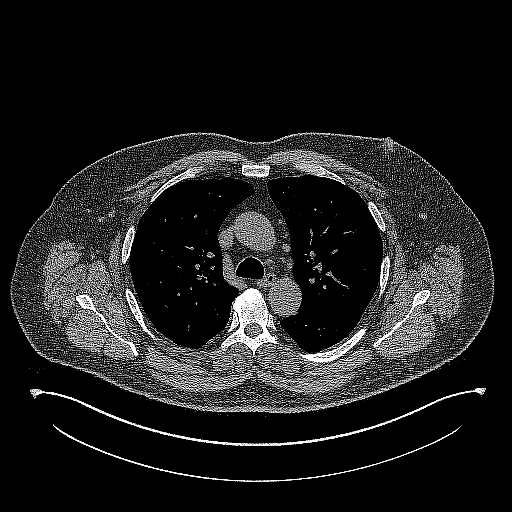
[im 268/394  lung]
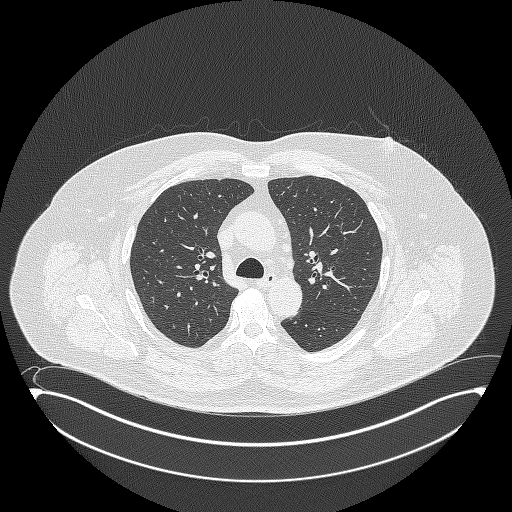
[im 304/394  lung]
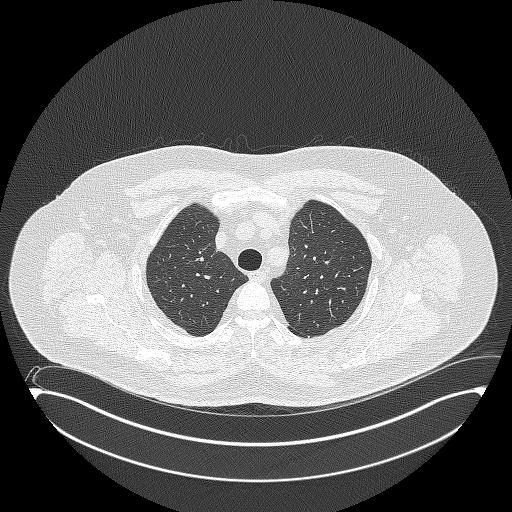
[im 340/394  lung]
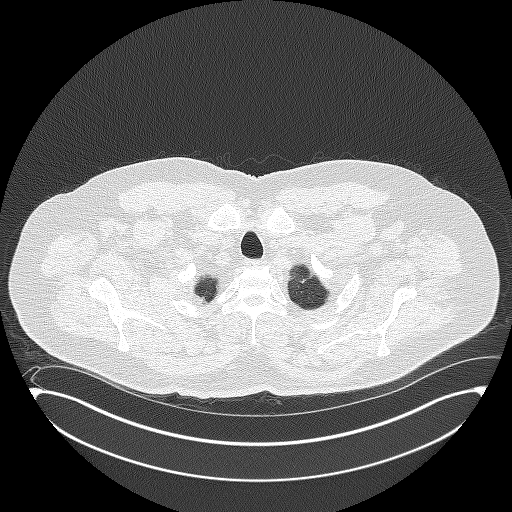
[im 376/394  lung]
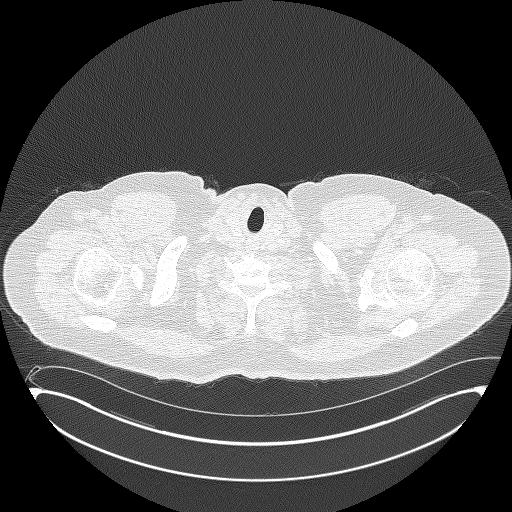

[15 of 40 positions shown; findings below may reference images not displayed]

FINDINGS: Cardiovascular: Heart size normal.  Aortic atherosclerosis.

Mediastinum/Nodes: No enlarged mediastinal, hilar, or axillary lymph
nodes. Thyroid gland, trachea, and esophagus demonstrate no
significant findings.

Lungs/Pleura: No pleural effusion, airspace consolidation, or
atelectasis. Mild changes of emphysema. Small pulmonary nodule
within the posterior right upper lobe has a mean derived diameter of
4.5 mm. No suspicious lung nodules identified at this time.

Upper Abdomen: No acute abnormality.  Hepatic steatosis identified.

Musculoskeletal: Degenerative disc disease is noted within the
thoracic spine. No acute or suspicious osseous findings.
IMPRESSION: 1. Lung-RADS 2, benign appearance or behavior. Continue annual
screening with low-dose chest CT without contrast in 12 months.
2. Hepatic steatosis.
3. Aortic Atherosclerosis (UO35H-M0R.R) and Emphysema (UO35H-N1V.9).

## 2022-06-24 ENCOUNTER — Ambulatory Visit (INDEPENDENT_AMBULATORY_CARE_PROVIDER_SITE_OTHER): Payer: Medicare Other | Admitting: Podiatry

## 2022-06-24 ENCOUNTER — Encounter: Payer: Self-pay | Admitting: Podiatry

## 2022-06-24 DIAGNOSIS — M79674 Pain in right toe(s): Secondary | ICD-10-CM

## 2022-06-24 DIAGNOSIS — M79675 Pain in left toe(s): Secondary | ICD-10-CM

## 2022-06-24 DIAGNOSIS — E1142 Type 2 diabetes mellitus with diabetic polyneuropathy: Secondary | ICD-10-CM

## 2022-06-24 DIAGNOSIS — B351 Tinea unguium: Secondary | ICD-10-CM

## 2022-06-24 NOTE — Progress Notes (Signed)
This patient presents to the office and does not know why he was reappointed.  He says he was diagnosed with fungal nails and is now being treated by the laser to clear his nail.  He says he has had 2 sessions with questional improvement.  He says he is diabetic.  General Appearance  Alert, conversant and in no acute stress.  Vascular  Dorsalis pedis and posterior tibial  pulses are palpable  bilaterally.  Capillary return is within normal limits  bilaterally. Temperature is within normal limits  bilaterally.  Neurologic  Senn-Weinstein monofilament wire test within normal limits  bilaterally. Muscle power within normal limits bilaterally.  Nails Thick disfigured discolored nails with subungual debris  from hallux to fifth toes bilaterally. No evidence of bacterial infection or drainage bilaterally.  Orthopedic  No limitations of motion  feet .  No crepitus or effusions noted.  No bony pathology or digital deformities noted.  Skin  normotropic skin with no porokeratosis noted bilaterally.  No signs of infections or ulcers noted.     Onychomycosis  B/l.  Debride hallux toenails with nail nipper and dremel tool.  Told to return for laser treatment as scheduled.  RTC   3 months    Tricha Ruggirello DPM    

## 2022-07-07 DIAGNOSIS — Z79899 Other long term (current) drug therapy: Secondary | ICD-10-CM | POA: Diagnosis not present

## 2022-07-07 DIAGNOSIS — F1120 Opioid dependence, uncomplicated: Secondary | ICD-10-CM | POA: Diagnosis not present

## 2022-07-07 DIAGNOSIS — Z5181 Encounter for therapeutic drug level monitoring: Secondary | ICD-10-CM | POA: Diagnosis not present

## 2022-07-11 ENCOUNTER — Ambulatory Visit (INDEPENDENT_AMBULATORY_CARE_PROVIDER_SITE_OTHER): Payer: Medicare Other | Admitting: Podiatry

## 2022-07-11 DIAGNOSIS — B351 Tinea unguium: Secondary | ICD-10-CM

## 2022-07-11 NOTE — Progress Notes (Signed)
Patient presents today for the 5th laser treatment. Diagnosed with mycotic nail infection by Dr. Ralene Cork.    Toenail most affected bilateral hallux.   All other systems are negative.   Nails were filed thin. Laser therapy was administered to bilateral 1-5 toenails  and patient tolerated the treatment well. All safety precautions were in place.      Follow up in 6 weeks for laser # 6.

## 2022-08-07 ENCOUNTER — Other Ambulatory Visit: Payer: Medicare Other

## 2022-08-10 IMAGING — US US ABDOMINAL AORTA SCREENING AAA
1 series · 14 of 20 positions shown · non-contrast
Comparison: CT abdomen and pelvis 06/14/2021

CLINICAL DATA: Medicare screening. 65-year-old male with smoking
history.

EXAM:
US ABDOMINAL AORTA MEDICARE SCREENING
TECHNIQUE: Ultrasound examination of the abdominal aorta was performed as a
screening evaluation for abdominal aortic aneurysm.

[Series 1: us abdominal aorta screening aaa · 0.34mm/px · 14 of 20 slices shown]
[im 1/20]
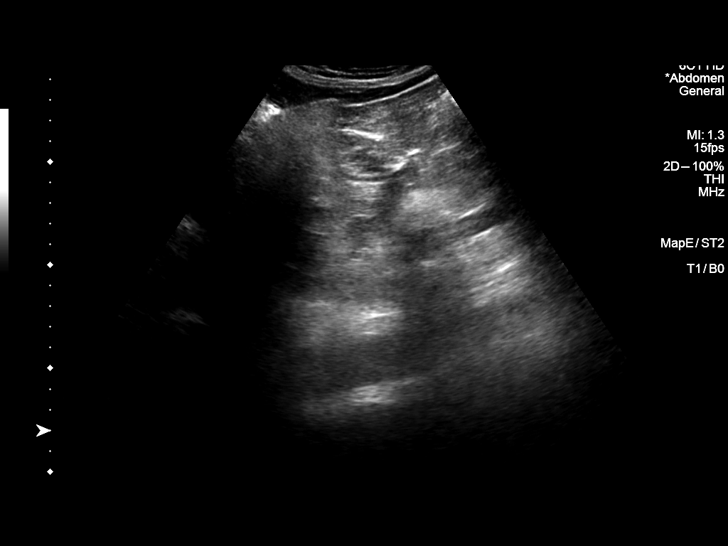
[im 3/20]
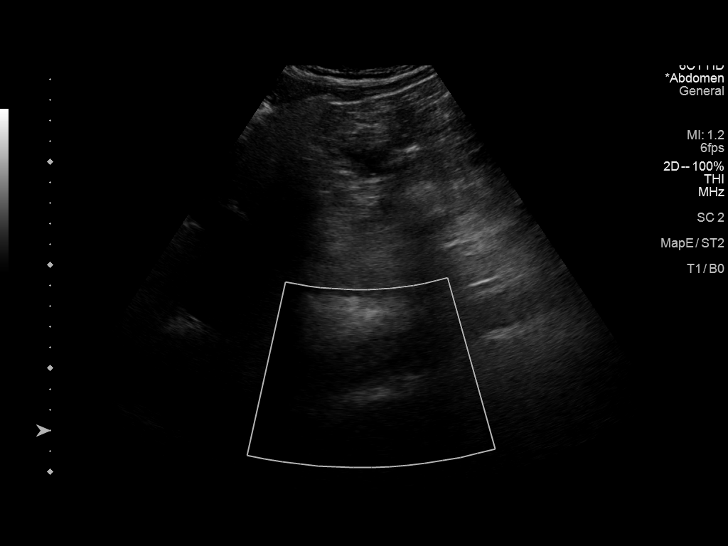
[im 4/20]
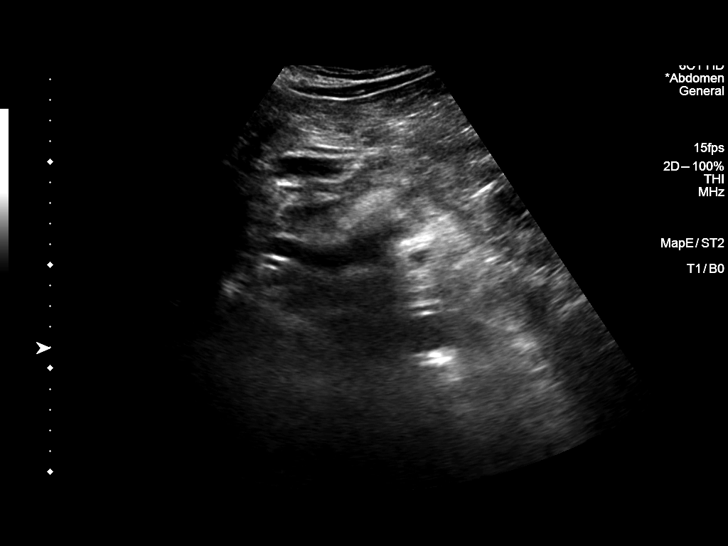
[im 6/20]
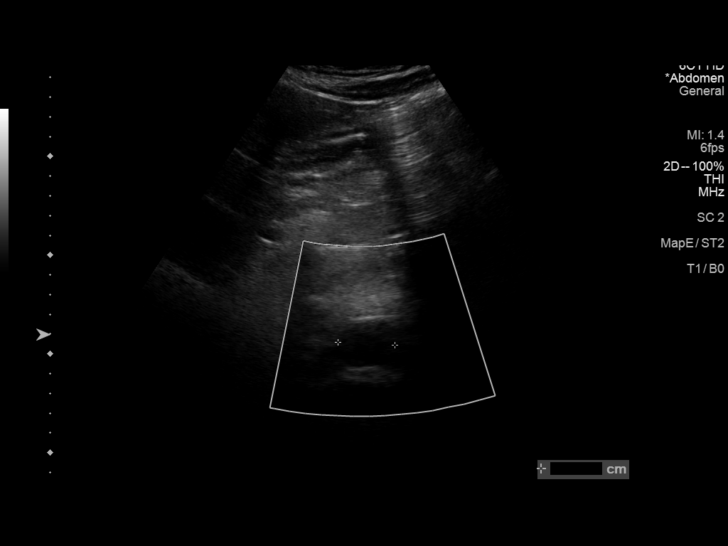
[im 7/20]
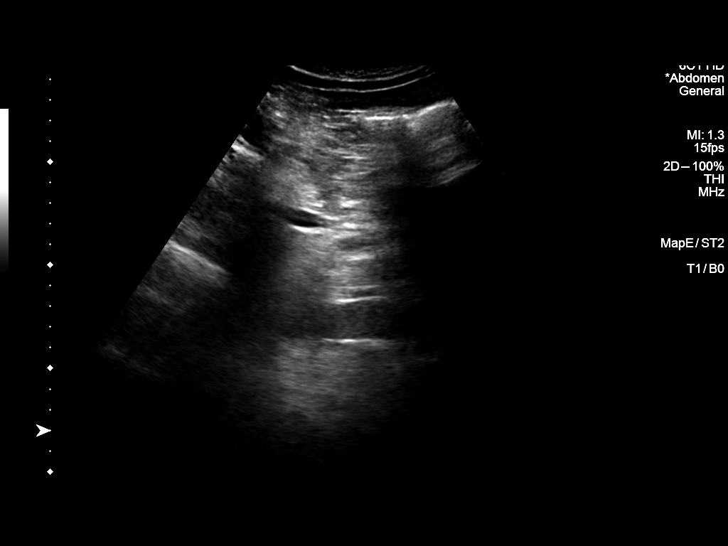
[im 8/20]
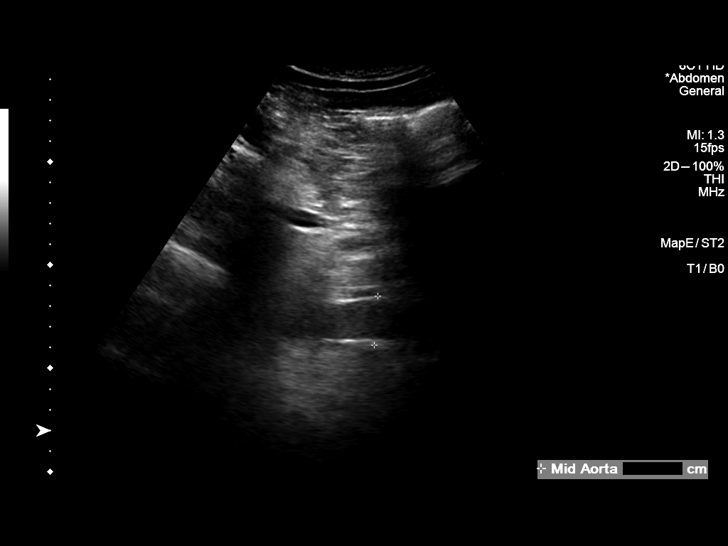
[im 10/20]
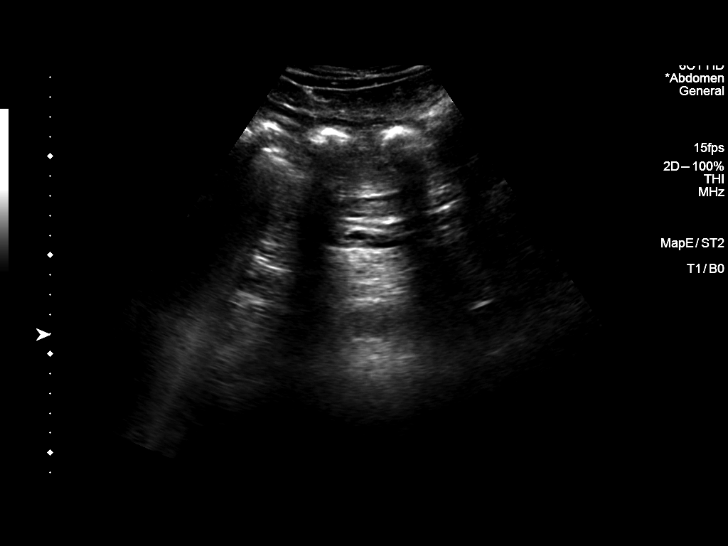
[im 11/20]
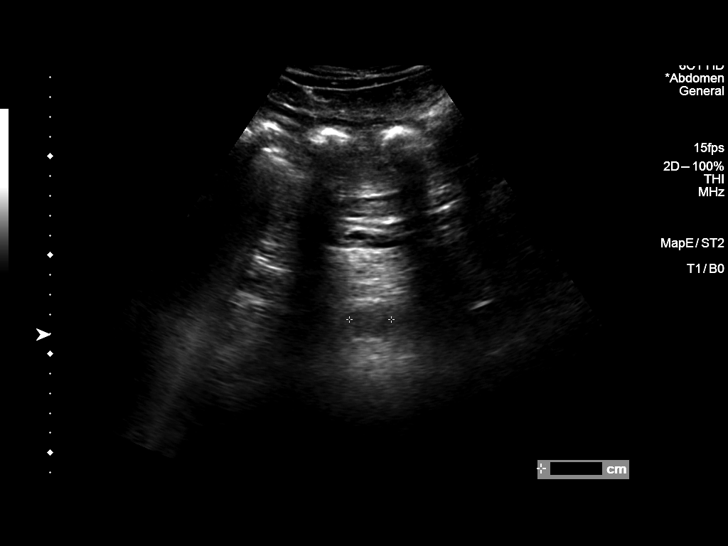
[im 13/20]
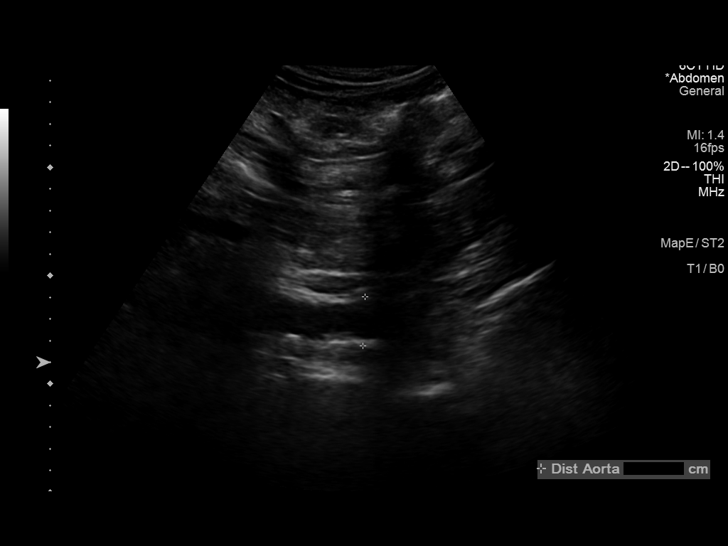
[im 14/20]
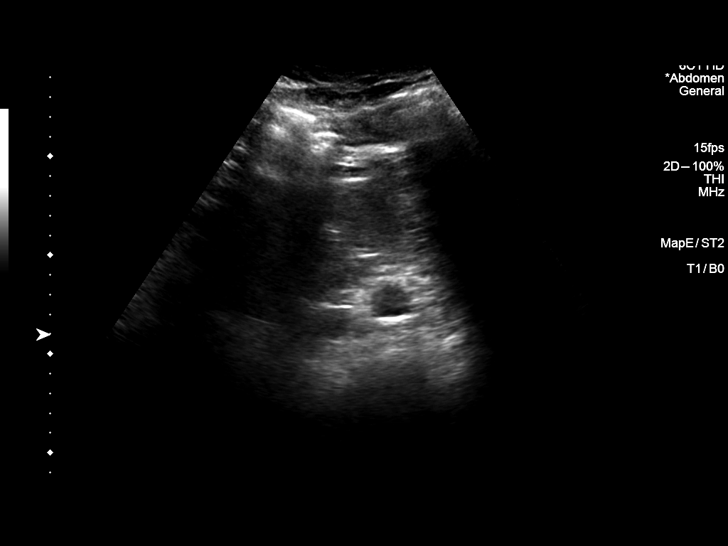
[im 16/20]
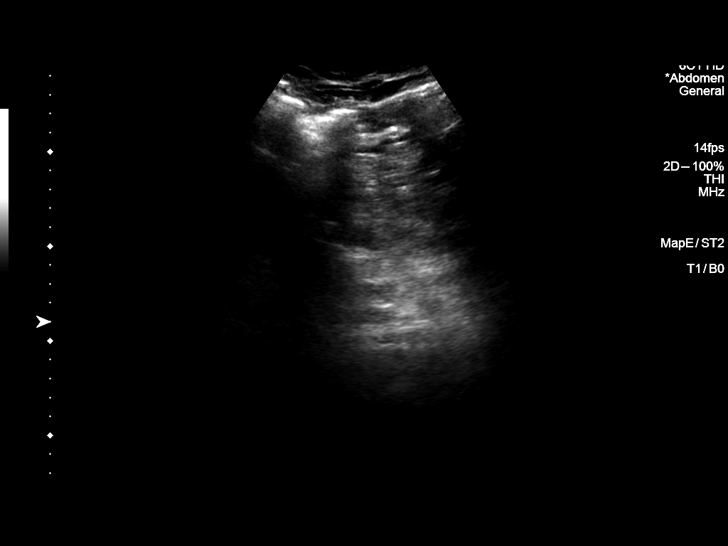
[im 17/20]
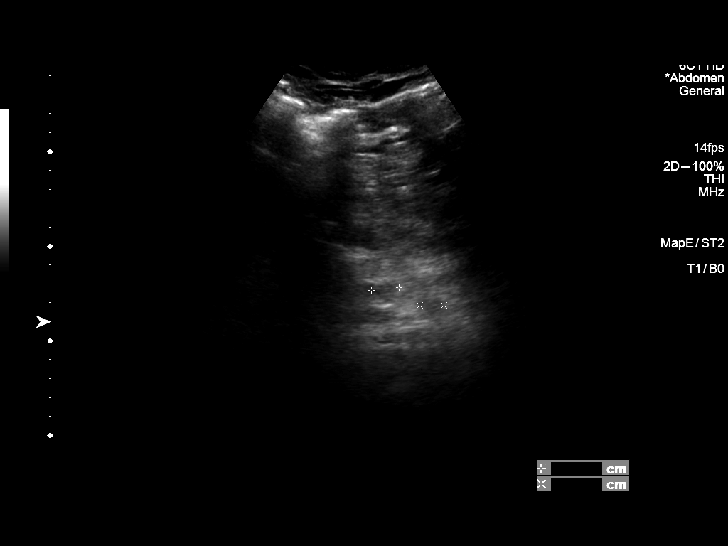
[im 18/20]
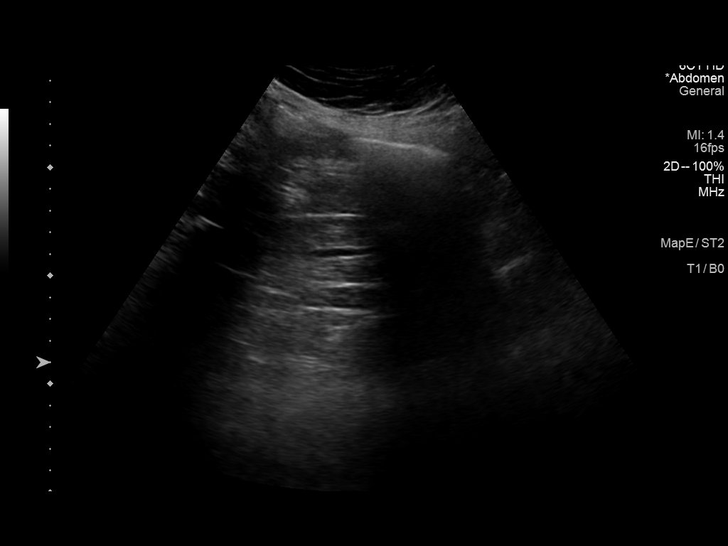
[im 20/20]
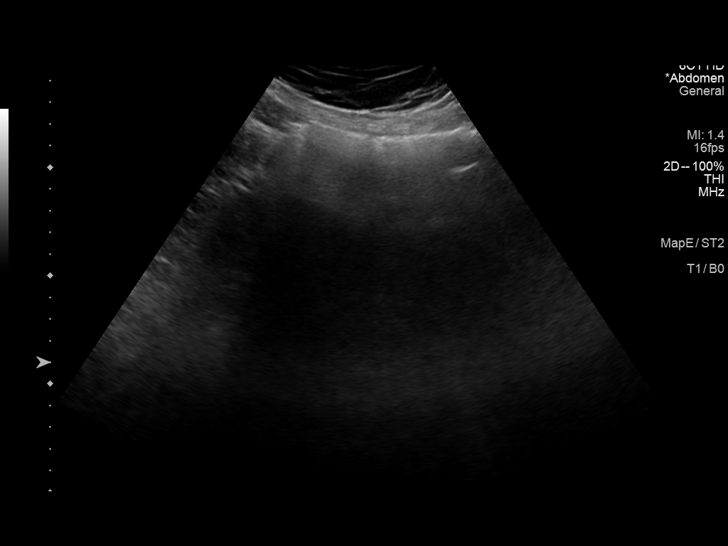

[14 of 20 positions shown; findings below may reference images not displayed]

FINDINGS: Abdominal aortic measurements as follows:

Proximal:  2.6 x 2.9 cm

Mid:  2.4 x 2.1 cm

Distal:  2.3 x 2.1 cm

Atherosclerosis is seen within the distal abdominal aorta.

The right iliac artery measures 1.5 cm in caliber. The left iliac
artery was obscured.
IMPRESSION: No abdominal aortic aneurysm.

Aortic Atherosclerosis (VO2EF-FRF.F).

## 2022-08-19 ENCOUNTER — Ambulatory Visit (INDEPENDENT_AMBULATORY_CARE_PROVIDER_SITE_OTHER): Payer: Medicare Other

## 2022-08-19 DIAGNOSIS — B351 Tinea unguium: Secondary | ICD-10-CM

## 2022-08-19 NOTE — Patient Instructions (Signed)

## 2022-08-19 NOTE — Progress Notes (Signed)
Patient presents today for the 6th laser treatment. Diagnosed with mycotic nail infection by Dr. Ralene Cork.    Toenail most affected bilateral hallux.   All other systems are negative.   Nails were filed thin. Laser therapy was administered to bilateral 1-5 toenails  and patient tolerated the treatment well. All safety precautions were in place.      Patient has completed the recommended laser treatments. He will follow up with Dr. Ralene Cork in 3 months to evaluate progress.

## 2022-08-25 DIAGNOSIS — F1120 Opioid dependence, uncomplicated: Secondary | ICD-10-CM | POA: Diagnosis not present

## 2022-08-29 DIAGNOSIS — E118 Type 2 diabetes mellitus with unspecified complications: Secondary | ICD-10-CM | POA: Diagnosis not present

## 2022-08-29 DIAGNOSIS — E782 Mixed hyperlipidemia: Secondary | ICD-10-CM | POA: Diagnosis not present

## 2022-08-29 DIAGNOSIS — I700 Atherosclerosis of aorta: Secondary | ICD-10-CM | POA: Diagnosis not present

## 2022-08-29 DIAGNOSIS — Z23 Encounter for immunization: Secondary | ICD-10-CM | POA: Diagnosis not present

## 2022-09-30 ENCOUNTER — Encounter: Payer: Self-pay | Admitting: Podiatry

## 2022-09-30 ENCOUNTER — Ambulatory Visit (INDEPENDENT_AMBULATORY_CARE_PROVIDER_SITE_OTHER): Payer: Medicare Other | Admitting: Podiatry

## 2022-09-30 DIAGNOSIS — M79675 Pain in left toe(s): Secondary | ICD-10-CM | POA: Diagnosis not present

## 2022-09-30 DIAGNOSIS — M79674 Pain in right toe(s): Secondary | ICD-10-CM | POA: Diagnosis not present

## 2022-09-30 DIAGNOSIS — E1142 Type 2 diabetes mellitus with diabetic polyneuropathy: Secondary | ICD-10-CM

## 2022-09-30 DIAGNOSIS — B351 Tinea unguium: Secondary | ICD-10-CM

## 2022-09-30 NOTE — Progress Notes (Signed)
This patient presents to the office and does not know why he was reappointed.  He says he was diagnosed with fungal nails and is now being treated by the laser to clear his nail.  He says he has had 2 sessions with questional improvement.  He says he is diabetic.  General Appearance  Alert, conversant and in no acute stress.  Vascular  Dorsalis pedis and posterior tibial  pulses are palpable  bilaterally.  Capillary return is within normal limits  bilaterally. Temperature is within normal limits  bilaterally.  Neurologic  Senn-Weinstein monofilament wire test within normal limits  bilaterally. Muscle power within normal limits bilaterally.  Nails Thick disfigured discolored nails with subungual debris  from hallux to fifth toes bilaterally. No evidence of bacterial infection or drainage bilaterally.  Orthopedic  No limitations of motion  feet .  No crepitus or effusions noted.  No bony pathology or digital deformities noted.  Skin  normotropic skin with no porokeratosis noted bilaterally.  No signs of infections or ulcers noted.     Onychomycosis  B/l.  Debride hallux toenails with nail nipper and dremel tool.  Told to return for laser treatment as scheduled.  RTC   3 months    Raquell Richer DPM    

## 2022-10-20 ENCOUNTER — Other Ambulatory Visit: Payer: Self-pay | Admitting: Acute Care

## 2022-10-20 DIAGNOSIS — F17210 Nicotine dependence, cigarettes, uncomplicated: Secondary | ICD-10-CM

## 2022-10-20 DIAGNOSIS — Z87891 Personal history of nicotine dependence: Secondary | ICD-10-CM

## 2022-10-29 ENCOUNTER — Other Ambulatory Visit: Payer: Medicare Other

## 2022-10-31 DIAGNOSIS — E782 Mixed hyperlipidemia: Secondary | ICD-10-CM | POA: Diagnosis not present

## 2022-10-31 DIAGNOSIS — I10 Essential (primary) hypertension: Secondary | ICD-10-CM | POA: Diagnosis not present

## 2022-10-31 DIAGNOSIS — Z8619 Personal history of other infectious and parasitic diseases: Secondary | ICD-10-CM | POA: Diagnosis not present

## 2022-10-31 DIAGNOSIS — E118 Type 2 diabetes mellitus with unspecified complications: Secondary | ICD-10-CM | POA: Diagnosis not present

## 2022-10-31 DIAGNOSIS — Z125 Encounter for screening for malignant neoplasm of prostate: Secondary | ICD-10-CM | POA: Diagnosis not present

## 2022-10-31 DIAGNOSIS — Z0000 Encounter for general adult medical examination without abnormal findings: Secondary | ICD-10-CM | POA: Diagnosis not present

## 2022-11-17 ENCOUNTER — Other Ambulatory Visit: Payer: Medicare Other

## 2022-11-17 DIAGNOSIS — F1120 Opioid dependence, uncomplicated: Secondary | ICD-10-CM | POA: Diagnosis not present

## 2022-11-28 ENCOUNTER — Telehealth: Payer: Self-pay

## 2022-11-28 NOTE — Telephone Encounter (Signed)
VM for [facility] asking to call patient.  Did not state reason for call.  Attempt to call but no answer and no VM after multiple rings

## 2022-12-15 DIAGNOSIS — F1120 Opioid dependence, uncomplicated: Secondary | ICD-10-CM | POA: Diagnosis not present

## 2022-12-16 ENCOUNTER — Ambulatory Visit
Admission: RE | Admit: 2022-12-16 | Discharge: 2022-12-16 | Disposition: A | Discharge status: Home / Self Care | Payer: Medicare Other | Source: Ambulatory Visit | Attending: Acute Care | Admitting: Acute Care

## 2022-12-16 DIAGNOSIS — Z87891 Personal history of nicotine dependence: Secondary | ICD-10-CM | POA: Diagnosis not present

## 2022-12-16 DIAGNOSIS — I2510 Atherosclerotic heart disease of native coronary artery without angina pectoris: Secondary | ICD-10-CM | POA: Diagnosis not present

## 2022-12-16 DIAGNOSIS — F17210 Nicotine dependence, cigarettes, uncomplicated: Secondary | ICD-10-CM

## 2022-12-16 DIAGNOSIS — J432 Centrilobular emphysema: Secondary | ICD-10-CM | POA: Diagnosis not present

## 2022-12-16 DIAGNOSIS — K760 Fatty (change of) liver, not elsewhere classified: Secondary | ICD-10-CM | POA: Diagnosis not present

## 2022-12-18 ENCOUNTER — Other Ambulatory Visit: Payer: Self-pay | Admitting: Acute Care

## 2022-12-18 DIAGNOSIS — Z87891 Personal history of nicotine dependence: Secondary | ICD-10-CM

## 2022-12-18 DIAGNOSIS — F17210 Nicotine dependence, cigarettes, uncomplicated: Secondary | ICD-10-CM

## 2022-12-18 DIAGNOSIS — Z122 Encounter for screening for malignant neoplasm of respiratory organs: Secondary | ICD-10-CM

## 2022-12-22 ENCOUNTER — Ambulatory Visit: Payer: Medicare Other | Admitting: Podiatry

## 2022-12-29 DIAGNOSIS — F1120 Opioid dependence, uncomplicated: Secondary | ICD-10-CM | POA: Diagnosis not present

## 2022-12-31 ENCOUNTER — Ambulatory Visit: Payer: Medicare Other | Admitting: Podiatry

## 2022-12-31 DIAGNOSIS — M79675 Pain in left toe(s): Secondary | ICD-10-CM

## 2022-12-31 DIAGNOSIS — E1142 Type 2 diabetes mellitus with diabetic polyneuropathy: Secondary | ICD-10-CM

## 2022-12-31 DIAGNOSIS — B351 Tinea unguium: Secondary | ICD-10-CM

## 2022-12-31 DIAGNOSIS — M79674 Pain in right toe(s): Secondary | ICD-10-CM

## 2022-12-31 NOTE — Progress Notes (Signed)
This patient presents to the office and does not know why he was reappointed.  He says he was diagnosed with fungal nails and is now being treated by the laser to clear his nail.  He says he has had 2 sessions with questional improvement.  He says he is diabetic.  General Appearance  Alert, conversant and in no acute stress.  Vascular  Dorsalis pedis and posterior tibial  pulses are palpable  bilaterally.  Capillary return is within normal limits  bilaterally. Temperature is within normal limits  bilaterally.  Neurologic  Senn-Weinstein monofilament wire test within normal limits  bilaterally. Muscle power within normal limits bilaterally.  Nails Thick disfigured discolored nails with subungual debris  from hallux to fifth toes bilaterally. No evidence of bacterial infection or drainage bilaterally.  Orthopedic  No limitations of motion  feet .  No crepitus or effusions noted.  No bony pathology or digital deformities noted.  Skin  normotropic skin with no porokeratosis noted bilaterally.  No signs of infections or ulcers noted.     Onychomycosis  B/l.  Debride hallux toenails with nail nipper and dremel tool.  Told to return for laser treatment as scheduled.  RTC   3 months    Gardiner Barefoot DPM

## 2023-01-09 ENCOUNTER — Encounter: Payer: Self-pay | Admitting: Pulmonary Disease

## 2023-01-09 ENCOUNTER — Ambulatory Visit (INDEPENDENT_AMBULATORY_CARE_PROVIDER_SITE_OTHER): Payer: Medicare Other | Admitting: Pulmonary Disease

## 2023-01-09 VITALS — BP 130/64 | HR 82 | Temp 98.2°F | Ht 68.5 in | Wt 262.0 lb

## 2023-01-09 DIAGNOSIS — F17210 Nicotine dependence, cigarettes, uncomplicated: Secondary | ICD-10-CM

## 2023-01-09 DIAGNOSIS — J432 Centrilobular emphysema: Secondary | ICD-10-CM | POA: Diagnosis not present

## 2023-01-09 DIAGNOSIS — R0602 Shortness of breath: Secondary | ICD-10-CM | POA: Diagnosis not present

## 2023-01-09 LAB — CBC WITH DIFFERENTIAL/PLATELET
Basophils Absolute: 0 10*3/uL (ref 0.0–0.1)
Basophils Relative: 0 % (ref 0.0–3.0)
Eosinophils Absolute: 0.4 10*3/uL (ref 0.0–0.7)
Eosinophils Relative: 6 % — ABNORMAL HIGH (ref 0.0–5.0)
HCT: 42.2 % (ref 39.0–52.0)
Hemoglobin: 14.3 g/dL (ref 13.0–17.0)
Lymphocytes Relative: 37.1 % (ref 12.0–46.0)
Lymphs Abs: 2.6 10*3/uL (ref 0.7–4.0)
MCHC: 34 g/dL (ref 30.0–36.0)
MCV: 92.9 fl (ref 78.0–100.0)
Monocytes Absolute: 0.5 10*3/uL (ref 0.1–1.0)
Monocytes Relative: 7.2 % (ref 3.0–12.0)
Neutro Abs: 3.5 10*3/uL (ref 1.4–7.7)
Neutrophils Relative %: 49.7 % (ref 43.0–77.0)
Platelets: 213 10*3/uL (ref 150.0–400.0)
RBC: 4.54 Mil/uL (ref 4.22–5.81)
RDW: 13.2 % (ref 11.5–15.5)
WBC: 7.1 10*3/uL (ref 4.0–10.5)

## 2023-01-09 MED ORDER — ANORO ELLIPTA 62.5-25 MCG/ACT IN AEPB: 1.0000 | INHALATION_SPRAY | Freq: Every day | RESPIRATORY_TRACT | 2 refills | Status: AC

## 2023-01-09 MED ORDER — VARENICLINE TARTRATE (STARTER) 0.5 MG X 11 & 1 MG X 42 PO TBPK: ORAL_TABLET | ORAL | 0 refills | Status: AC

## 2023-01-09 NOTE — Progress Notes (Signed)
Derrick Warner    [id number]    August 21, 1956  Primary Care Physician:[name], [name], MD  Referring Physician: Caren Macadam, [place],  [place] [address]  Chief complaint: Consult for emphysema  HPI: 67 y.o. active smoker who  has a past medical history of Alcohol abuse and Drug abuse ([place]).,  Hypertension, diabetes, hyperlipidemia, emphysema  Referred for evaluation of emphysema found on low-dose screening CT.  He has symptoms of dyspnea on exertion for many years.  Occasional cough with wheezing and chest congestion.  Pets: No pets Occupation: Used to work in Programme researcher, broadcasting/film/video, Architect Exposures: Does not recall exposure to asbestos.  May have feather pillows but he is not sure.  No hot tubs, [name] is a mold Smoking history: 48-pack-year smoker.  Continues to smoke 0.5 to 1 pack/day Travel history: Originally from [place].  No significant recent travel Relevant family history: No family history of lung disease  Outpatient Encounter Medications as of 01/09/2023  Medication Sig   aspirin EC 81 MG tablet Take 81 mg by mouth daily. Swallow whole.   cetirizine (ZYRTEC) 5 MG tablet Take 5 mg by mouth daily.   fenofibrate micronized (LOFIBRA) 134 MG capsule Take 134 mg by mouth daily.   icosapent Ethyl (VASCEPA) 1 g capsule Take 2 g by mouth 2 (two) times daily.   lisinopril (ZESTRIL) 2.5 MG tablet Take 2.5 mg by mouth daily.   metFORMIN (GLUCOPHAGE-XR) 500 MG 24 hr tablet Take 500 mg by mouth 2 (two) times daily.   methadone (DOLOPHINE) 10 MG/ML solution Take 5 mg by mouth daily.   nicotine (NICODERM CQ - DOSED IN MG/24 HOURS) 14 mg/24hr patch Place 14 mg onto the skin daily. (Patient not taking: Reported on 01/09/2023)   No facility-administered encounter medications on file as of 01/09/2023.    Allergies as of 01/09/2023 - Review Complete 01/09/2023  Allergen Reaction Noted   Atorvastatin Nausea Only 09/25/2021   Rosuvastatin calcium Other (See  Comments) 03/04/2022    Past Medical History:  Diagnosis Date   Alcohol abuse    Drug abuse ([place])     No past surgical history on file.  No family history on file.  Social History   Socioeconomic History   Marital status: Divorced    Spouse name: Not on file   Number of children: Not on file   Years of education: Not on file   Highest education level: Not on file  Occupational History   Not on file  Tobacco Use   Smoking status: Every Day    Packs/day: 1.00    Years: 47.00    Total pack years: 47.00    Types: Cigarettes   Smokeless tobacco: Not on file   Tobacco comments:    1/2-1 pack daily  Substance and Sexual Activity   Alcohol use: Yes   Drug use: Not on file   Sexual activity: Not on file  Other Topics Concern   Not on file  Social History Narrative   Not on file   Social Determinants of Health   Financial Resource Strain: Not on file  Food Insecurity: Not on file  Transportation Needs: Not on file  Physical Activity: Not on file  Stress: Not on file  Social Connections: Not on file  Intimate Partner Violence: Not on file    Review of systems: Review of Systems  Constitutional: Negative for fever and chills.  HENT: Negative.   Eyes: Negative for blurred vision.  Respiratory: as per HPI  Cardiovascular: Negative for chest pain and palpitations.  Gastrointestinal: Negative for vomiting, diarrhea, blood per rectum. Genitourinary: Negative for dysuria, urgency, frequency and hematuria.  Musculoskeletal: Negative for myalgias, back pain and joint pain.  Skin: Negative for itching and rash.  Neurological: Negative for dizziness, tremors, focal weakness, seizures and loss of consciousness.  Endo/Heme/Allergies: Negative for environmental allergies.  Psychiatric/Behavioral: Negative for depression, suicidal ideas and hallucinations.  All other systems reviewed and are negative.  Physical Exam: Blood pressure 130/64, pulse 82, temperature 98.2 F  (36.8 C), temperature source Oral, height 5' 8.5" (1.74 m), weight 262 lb (118.8 kg), SpO2 95 %. Gen:      No acute distress HEENT:  EOMI, sclera anicteric Neck:     No masses; no thyromegaly Lungs:    Clear to auscultation bilaterally; normal respiratory effort CV:         Regular rate and rhythm; no murmurs Abd:      + bowel sounds; soft, non-tender; no palpable masses, no distension Ext:    No edema; adequate peripheral perfusion Skin:      Warm and dry; no rash Neuro: alert and oriented x 3 Psych: normal mood and affect  Data Reviewed: Imaging: Low-dose screening CT 10/29/2021-mild emphysema, 4.5 mm pulmonary nodule Low-dose screening CT 12/16/2022-mild emphysema, lung nodules identified I have reviewed the images personally.  PFTs:  Labs: CBC 08/24/2012-WBC 7, eos 1%, absolute eosinophil count 70  Assessment:  Emphysema Noted on screening CT chest.  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  Will not need inhaled corticosteroids as there are no elevated peripheral eosinophils in the past Recheck CBC with differential and IgE  Active smoker We discussed smoking cessation in detail.  He is planning to start nicotine patches.  I will add Chantix to reduce cravings Time spent counseling-5 minutes.  Reassess at return visit.  Plan/Recommendations: CBC with differential, IgE Start Anoro inhaler PFTs Nicotine patches, Chantix  Marshell Garfinkel MD [facility] 01/09/2023, 9:49 AM  CC: Caren Macadam, MD

## 2023-01-09 NOTE — Patient Instructions (Addendum)
We will check some labs today including CBC differential, IgE Start Anoro inhaler For years smoking please continue to use nicotine patches.  Will order Chantix. Schedule PFTs and follow-up in clinic in 1 to 2 months.  Crap

## 2023-01-12 DIAGNOSIS — F1120 Opioid dependence, uncomplicated: Secondary | ICD-10-CM | POA: Diagnosis not present

## 2023-01-12 LAB — IGE: IgE (Immunoglobulin E), Serum: 12 kU/L (ref <=114)

## 2023-01-26 DIAGNOSIS — F1120 Opioid dependence, uncomplicated: Secondary | ICD-10-CM | POA: Diagnosis not present

## 2023-01-29 DIAGNOSIS — H43812 Vitreous degeneration, left eye: Secondary | ICD-10-CM | POA: Diagnosis not present

## 2023-01-29 DIAGNOSIS — H1045 Other chronic allergic conjunctivitis: Secondary | ICD-10-CM | POA: Diagnosis not present

## 2023-01-29 DIAGNOSIS — H2513 Age-related nuclear cataract, bilateral: Secondary | ICD-10-CM | POA: Diagnosis not present

## 2023-01-29 DIAGNOSIS — E119 Type 2 diabetes mellitus without complications: Secondary | ICD-10-CM | POA: Diagnosis not present

## 2023-02-02 ENCOUNTER — Encounter: Payer: Self-pay | Admitting: Pulmonary Disease

## 2023-02-02 ENCOUNTER — Ambulatory Visit (INDEPENDENT_AMBULATORY_CARE_PROVIDER_SITE_OTHER): Payer: Medicare Other | Admitting: Pulmonary Disease

## 2023-02-02 ENCOUNTER — Other Ambulatory Visit: Payer: Self-pay

## 2023-02-02 VITALS — BP 124/70 | HR 82 | Temp 97.9°F | Ht 69.5 in | Wt 263.4 lb

## 2023-02-02 DIAGNOSIS — R0602 Shortness of breath: Secondary | ICD-10-CM | POA: Diagnosis not present

## 2023-02-02 DIAGNOSIS — J432 Centrilobular emphysema: Secondary | ICD-10-CM

## 2023-02-02 DIAGNOSIS — F17210 Nicotine dependence, cigarettes, uncomplicated: Secondary | ICD-10-CM

## 2023-02-02 LAB — PULMONARY FUNCTION TEST
DL/VA % pred: 84 %
DL/VA: 3.49 ml/min/mmHg/L
DLCO cor % pred: 66 %
DLCO cor: 17.21 ml/min/mmHg
DLCO unc % pred: 65 %
DLCO unc: 17.06 ml/min/mmHg
FEF 25-75 Post: 2.52 L/sec
FEF 25-75 Pre: 2.23 L/sec
FEF2575-%Change-Post: 13 %
FEF2575-%Pred-Post: 96 %
FEF2575-%Pred-Pre: 85 %
FEV1-%Change-Post: 0 %
FEV1-%Pred-Post: 78 %
FEV1-%Pred-Pre: 77 %
FEV1-Post: 2.59 L
FEV1-Pre: 2.57 L
FEV1FVC-%Change-Post: 0 %
FEV1FVC-%Pred-Pre: 105 %
FEV6-%Change-Post: 0 %
FEV6-%Pred-Post: 78 %
FEV6-%Pred-Pre: 77 %
FEV6-Post: 3.28 L
FEV6-Pre: 3.26 L
FEV6FVC-%Change-Post: 0 %
FEV6FVC-%Pred-Post: 105 %
FEV6FVC-%Pred-Pre: 105 %
FVC-%Change-Post: 0 %
FVC-%Pred-Post: 74 %
FVC-%Pred-Pre: 73 %
FVC-Post: 3.3 L
FVC-Pre: 3.27 L
Post FEV1/FVC ratio: 79 %
Post FEV6/FVC ratio: 100 %
Pre FEV1/FVC ratio: 79 %
Pre FEV6/FVC Ratio: 100 %

## 2023-02-02 NOTE — Progress Notes (Signed)
Pre and post spirometry and DLCO completed today  ?

## 2023-02-02 NOTE — Progress Notes (Unsigned)
Derrick Warner    [id number]    14-Jul-1956  Primary Care Physician:[name], [name], MD  Referring Physician: Caren Macadam, [place],  [place] [address]  Chief complaint: Follow-up for emphysema  HPI: 67 y.o. active smoker who  has a past medical history of Alcohol abuse and Drug abuse ([place]).,  Hypertension, diabetes, hyperlipidemia, emphysema  Referred for evaluation of emphysema found on low-dose screening CT.  He has symptoms of dyspnea on exertion for many years.  Occasional cough with wheezing and chest congestion.  Pets: No pets Occupation: Used to work in Programme researcher, broadcasting/film/video, Architect Exposures: Does not recall exposure to asbestos.  May have feather pillows but he is not sure.  No hot tubs, [name] is a mold Smoking history: 48-pack-year smoker.  Continues to smoke 0.5 to 1 pack/day Travel history: Originally from [place].  No significant recent travel Relevant family history: No family history of lung disease  Interim history: Here for review of PFTs States that breathing is stable Started on Anoro inhaler but did not see any difference Continues to smoke  Outpatient Encounter Medications as of 02/02/2023  Medication Sig   aspirin EC 81 MG tablet Take 81 mg by mouth daily. Swallow whole.   cetirizine (ZYRTEC) 5 MG tablet Take 5 mg by mouth daily.   fenofibrate micronized (LOFIBRA) 134 MG capsule Take 134 mg by mouth daily.   icosapent Ethyl (VASCEPA) 1 g capsule Take 2 g by mouth 2 (two) times daily.   lisinopril (ZESTRIL) 2.5 MG tablet Take 2.5 mg by mouth daily.   metFORMIN (GLUCOPHAGE-XR) 500 MG 24 hr tablet Take 500 mg by mouth 2 (two) times daily.   methadone (DOLOPHINE) 10 MG/ML solution Take 5 mg by mouth daily.   nicotine (NICODERM CQ - DOSED IN MG/24 HOURS) 14 mg/24hr patch Place 14 mg onto the skin daily. (Patient not taking: Reported on 01/09/2023)   umeclidinium-vilanterol (ANORO ELLIPTA) 62.5-25 MCG/ACT AEPB Inhale 1 puff  into the lungs daily. (Patient not taking: Reported on 02/02/2023)   Varenicline Tartrate, Starter, (CHANTIX STARTING MONTH PAK) 0.5 MG X 11 & 1 MG X 42 TBPK Take 1 0.5 mg tablet once daily for 3 days, increase to 1 0.5 mg tablet twice daily for 4 days, increase to 1 1 mg tablet twice daily. (Patient not taking: Reported on 02/02/2023)   No facility-administered encounter medications on file as of 02/02/2023.   Physical Exam: Blood pressure 124/70, pulse 82, temperature 97.9 F (36.6 C), temperature source Oral, height 5' 9.5" (1.765 m), weight 263 lb 6.4 oz (119.5 kg), SpO2 96 %. Gen:      No acute distress HEENT:  EOMI, sclera anicteric Neck:     No masses; no thyromegaly Lungs:    Clear to auscultation bilaterally; normal respiratory effort CV:         Regular rate and rhythm; no murmurs Abd:      + bowel sounds; soft, non-tender; no palpable masses, no distension Ext:    No edema; adequate peripheral perfusion Skin:      Warm and dry; no rash Neuro: alert and oriented x 3 Psych: normal mood and affect   Data Reviewed: Imaging: Low-dose screening CT 10/29/2021-mild emphysema, 4.5 mm pulmonary nodule Low-dose screening CT 12/16/2022-mild emphysema, lung nodules identified I have reviewed the images personally.  PFTs: 02/02/2023 FVC 3.30 [74%], FEV1 3.59 [78%], F/F79, DLCO 17.6 [65%] No obstruction, mild diffusion defect  Labs: CBC 08/24/2012-WBC 7, eos 1%, absolute  eosinophil count 70 CBC 01/09/2023-WBC 7.1, eos 6%, absolute eosinophil count 426 IgE 01/09/2023-12  Assessment:  Emphysema Noted on screening CT chest.  The PFTs did not show any obstruction.  He has not responded to Anoro inhaler and will stop it  Active smoker We discussed smoking cessation in detail.  He is planning to start nicotine patches.  I will add Chantix to reduce cravings Time spent counseling-5 minutes.  Reassess at return visit.  Plan/Recommendations: Stop Anoro inhaler Nicotine patches,  Chantix  Marshell Garfinkel MD [facility] 02/02/2023, 3:26 PM  CC: Caren Macadam, MD

## 2023-02-02 NOTE — Patient Instructions (Signed)
I reviewed your PFT which shows no significant COPD You can stop the Anoro inhaler Continue to work on smoking cessation Follow-up in 1 year.

## 2023-02-09 DIAGNOSIS — F1120 Opioid dependence, uncomplicated: Secondary | ICD-10-CM | POA: Diagnosis not present

## 2023-02-23 DIAGNOSIS — F1120 Opioid dependence, uncomplicated: Secondary | ICD-10-CM | POA: Diagnosis not present

## 2023-03-09 DIAGNOSIS — F1120 Opioid dependence, uncomplicated: Secondary | ICD-10-CM | POA: Diagnosis not present

## 2023-04-01 ENCOUNTER — Ambulatory Visit: Payer: Disability Insurance | Admitting: Podiatry

## 2023-04-06 ENCOUNTER — Ambulatory Visit (INDEPENDENT_AMBULATORY_CARE_PROVIDER_SITE_OTHER): Payer: Medicare Other | Admitting: Podiatry

## 2023-04-06 ENCOUNTER — Encounter: Payer: Self-pay | Admitting: Podiatry

## 2023-04-06 DIAGNOSIS — M79675 Pain in left toe(s): Secondary | ICD-10-CM

## 2023-04-06 DIAGNOSIS — B351 Tinea unguium: Secondary | ICD-10-CM

## 2023-04-06 DIAGNOSIS — E1142 Type 2 diabetes mellitus with diabetic polyneuropathy: Secondary | ICD-10-CM | POA: Diagnosis not present

## 2023-04-06 DIAGNOSIS — M79674 Pain in right toe(s): Secondary | ICD-10-CM

## 2023-04-06 DIAGNOSIS — F1120 Opioid dependence, uncomplicated: Secondary | ICD-10-CM | POA: Diagnosis not present

## 2023-04-06 NOTE — Progress Notes (Signed)
This patient presents to the office and does not know why he was reappointed.  He says he was diagnosed with fungal nails and is now being treated by the laser to clear his nail.  He says he has had 2 sessions with questional improvement.  He says he is diabetic.  General Appearance  Alert, conversant and in no acute stress.  Vascular  Dorsalis pedis and posterior tibial  pulses are palpable  bilaterally.  Capillary return is within normal limits  bilaterally. Temperature is within normal limits  bilaterally.  Neurologic  Senn-Weinstein monofilament wire test within normal limits  bilaterally. Muscle power within normal limits bilaterally.  Nails Thick disfigured discolored nails with subungual debris  from hallux to fifth toes bilaterally. No evidence of bacterial infection or drainage bilaterally.  Orthopedic  No limitations of motion  feet .  No crepitus or effusions noted.  No bony pathology or digital deformities noted.  Skin  normotropic skin with no porokeratosis noted bilaterally.  No signs of infections or ulcers noted.     Onychomycosis  B/l.  Debride hallux toenails with nail nipper and dremel tool.  Patient chooses to use Formula 7 to treat his nails.   RTC   3 months    Helane Gunther DPM

## 2023-05-01 DIAGNOSIS — E118 Type 2 diabetes mellitus with unspecified complications: Secondary | ICD-10-CM | POA: Diagnosis not present

## 2023-05-01 DIAGNOSIS — E1142 Type 2 diabetes mellitus with diabetic polyneuropathy: Secondary | ICD-10-CM | POA: Diagnosis not present

## 2023-05-01 DIAGNOSIS — E782 Mixed hyperlipidemia: Secondary | ICD-10-CM | POA: Diagnosis not present

## 2023-05-01 DIAGNOSIS — J439 Emphysema, unspecified: Secondary | ICD-10-CM | POA: Diagnosis not present

## 2023-05-01 DIAGNOSIS — I700 Atherosclerosis of aorta: Secondary | ICD-10-CM | POA: Diagnosis not present

## 2023-05-04 DIAGNOSIS — F1120 Opioid dependence, uncomplicated: Secondary | ICD-10-CM | POA: Diagnosis not present

## 2023-06-01 DIAGNOSIS — F1120 Opioid dependence, uncomplicated: Secondary | ICD-10-CM | POA: Diagnosis not present

## 2023-06-25 DIAGNOSIS — F1121 Opioid dependence, in remission: Secondary | ICD-10-CM | POA: Diagnosis not present

## 2023-06-29 DIAGNOSIS — F1120 Opioid dependence, uncomplicated: Secondary | ICD-10-CM | POA: Diagnosis not present

## 2023-07-06 ENCOUNTER — Ambulatory Visit (INDEPENDENT_AMBULATORY_CARE_PROVIDER_SITE_OTHER): Payer: Medicare Other | Admitting: Podiatry

## 2023-07-06 ENCOUNTER — Encounter: Payer: Self-pay | Admitting: Podiatry

## 2023-07-06 DIAGNOSIS — E1142 Type 2 diabetes mellitus with diabetic polyneuropathy: Secondary | ICD-10-CM | POA: Diagnosis not present

## 2023-07-06 DIAGNOSIS — M79674 Pain in right toe(s): Secondary | ICD-10-CM

## 2023-07-06 DIAGNOSIS — M79675 Pain in left toe(s): Secondary | ICD-10-CM | POA: Diagnosis not present

## 2023-07-06 DIAGNOSIS — B351 Tinea unguium: Secondary | ICD-10-CM

## 2023-07-06 NOTE — Progress Notes (Signed)
This patient presents to the office  for continued treatment of his fungus toenails both feet.  He has had lazer treatment and used Formula 7.  He still has continued fungus in his toenails.  He presents for evaluation and treatment.    General Appearance  Alert, conversant and in no acute stress.  Vascular  Dorsalis pedis and posterior tibial  pulses are palpable  bilaterally.  Capillary return is within normal limits  bilaterally. Temperature is within normal limits  bilaterally.  Neurologic  Senn-Weinstein monofilament wire test within normal limits  bilaterally. Muscle power within normal limits bilaterally.  Nails Thick disfigured discolored nails with subungual debris  from hallux to fifth toes bilaterally. No evidence of bacterial infection or drainage bilaterally.  Orthopedic  No limitations of motion  feet .  No crepitus or effusions noted.  No bony pathology or digital deformities noted.  Skin  normotropic skin with no porokeratosis noted bilaterally.  No signs of infections or ulcers noted.     Onychomycosis  B/l.  Debride toenails with nail nipper and dremel tool.    RTC   3 months Told him to continue to use formula 7 since I performed mechanical debridement of his fungus nails.   Helane Gunther DPM

## 2023-07-27 DIAGNOSIS — F1120 Opioid dependence, uncomplicated: Secondary | ICD-10-CM | POA: Diagnosis not present

## 2023-08-16 DIAGNOSIS — E119 Type 2 diabetes mellitus without complications: Secondary | ICD-10-CM | POA: Diagnosis not present

## 2023-08-24 DIAGNOSIS — F1120 Opioid dependence, uncomplicated: Secondary | ICD-10-CM | POA: Diagnosis not present

## 2023-08-26 DIAGNOSIS — F1120 Opioid dependence, uncomplicated: Secondary | ICD-10-CM | POA: Diagnosis not present

## 2023-09-15 DIAGNOSIS — E119 Type 2 diabetes mellitus without complications: Secondary | ICD-10-CM | POA: Diagnosis not present

## 2023-09-21 DIAGNOSIS — F1120 Opioid dependence, uncomplicated: Secondary | ICD-10-CM | POA: Diagnosis not present

## 2023-10-06 ENCOUNTER — Encounter: Payer: Self-pay | Admitting: Podiatry

## 2023-10-06 ENCOUNTER — Ambulatory Visit (INDEPENDENT_AMBULATORY_CARE_PROVIDER_SITE_OTHER): Payer: Medicare Other | Admitting: Podiatry

## 2023-10-06 DIAGNOSIS — M79675 Pain in left toe(s): Secondary | ICD-10-CM | POA: Diagnosis not present

## 2023-10-06 DIAGNOSIS — M79674 Pain in right toe(s): Secondary | ICD-10-CM

## 2023-10-06 DIAGNOSIS — E1142 Type 2 diabetes mellitus with diabetic polyneuropathy: Secondary | ICD-10-CM

## 2023-10-06 DIAGNOSIS — B351 Tinea unguium: Secondary | ICD-10-CM | POA: Diagnosis not present

## 2023-10-06 NOTE — Progress Notes (Signed)
This patient presents to the office  for continued treatment of his fungus toenails both feet.  He has had lazer treatment and used Formula 7.  He still has continued fungus in his toenails.  He presents for evaluation and treatment.    General Appearance  Alert, conversant and in no acute stress.  Vascular  Dorsalis pedis and posterior tibial  pulses are palpable  bilaterally.  Capillary return is within normal limits  bilaterally. Temperature is within normal limits  bilaterally.  Neurologic  Senn-Weinstein monofilament wire test within normal limits  bilaterally. Muscle power within normal limits bilaterally.  Nails Thick disfigured discolored nails with subungual debris  from hallux to fifth toes bilaterally. No evidence of bacterial infection or drainage bilaterally.  Orthopedic  No limitations of motion  feet .  No crepitus or effusions noted.  No bony pathology or digital deformities noted.  Skin  normotropic skin with no porokeratosis noted bilaterally.  No signs of infections or ulcers noted.     Onychomycosis  B/l.  Debride toenails with nail nipper and dremel tool.    RTC   3 months    Helane Gunther DPM

## 2023-10-16 DIAGNOSIS — E119 Type 2 diabetes mellitus without complications: Secondary | ICD-10-CM | POA: Diagnosis not present

## 2023-10-19 DIAGNOSIS — F1120 Opioid dependence, uncomplicated: Secondary | ICD-10-CM | POA: Diagnosis not present

## 2023-11-15 DIAGNOSIS — E119 Type 2 diabetes mellitus without complications: Secondary | ICD-10-CM | POA: Diagnosis not present

## 2023-11-16 DIAGNOSIS — F1120 Opioid dependence, uncomplicated: Secondary | ICD-10-CM | POA: Diagnosis not present

## 2023-11-24 ENCOUNTER — Other Ambulatory Visit: Payer: Self-pay | Admitting: Acute Care

## 2023-11-24 DIAGNOSIS — Z122 Encounter for screening for malignant neoplasm of respiratory organs: Secondary | ICD-10-CM

## 2023-11-24 DIAGNOSIS — F17210 Nicotine dependence, cigarettes, uncomplicated: Secondary | ICD-10-CM

## 2023-11-24 DIAGNOSIS — Z87891 Personal history of nicotine dependence: Secondary | ICD-10-CM

## 2023-11-25 ENCOUNTER — Encounter: Payer: Self-pay | Admitting: Acute Care

## 2023-11-27 DIAGNOSIS — I10 Essential (primary) hypertension: Secondary | ICD-10-CM | POA: Diagnosis not present

## 2023-11-27 DIAGNOSIS — E782 Mixed hyperlipidemia: Secondary | ICD-10-CM | POA: Diagnosis not present

## 2023-11-27 DIAGNOSIS — Z125 Encounter for screening for malignant neoplasm of prostate: Secondary | ICD-10-CM | POA: Diagnosis not present

## 2023-11-27 DIAGNOSIS — Z23 Encounter for immunization: Secondary | ICD-10-CM | POA: Diagnosis not present

## 2023-11-27 DIAGNOSIS — J439 Emphysema, unspecified: Secondary | ICD-10-CM | POA: Diagnosis not present

## 2023-11-27 DIAGNOSIS — Z79899 Other long term (current) drug therapy: Secondary | ICD-10-CM | POA: Diagnosis not present

## 2023-11-27 DIAGNOSIS — Z0000 Encounter for general adult medical examination without abnormal findings: Secondary | ICD-10-CM | POA: Diagnosis not present

## 2023-11-27 DIAGNOSIS — K760 Fatty (change of) liver, not elsewhere classified: Secondary | ICD-10-CM | POA: Diagnosis not present

## 2023-11-27 DIAGNOSIS — I700 Atherosclerosis of aorta: Secondary | ICD-10-CM | POA: Diagnosis not present

## 2023-11-27 DIAGNOSIS — E118 Type 2 diabetes mellitus with unspecified complications: Secondary | ICD-10-CM | POA: Diagnosis not present

## 2023-12-03 DIAGNOSIS — E538 Deficiency of other specified B group vitamins: Secondary | ICD-10-CM | POA: Diagnosis not present

## 2023-12-10 DIAGNOSIS — E538 Deficiency of other specified B group vitamins: Secondary | ICD-10-CM | POA: Diagnosis not present

## 2023-12-14 DIAGNOSIS — F1120 Opioid dependence, uncomplicated: Secondary | ICD-10-CM | POA: Diagnosis not present

## 2023-12-16 DIAGNOSIS — E119 Type 2 diabetes mellitus without complications: Secondary | ICD-10-CM | POA: Diagnosis not present

## 2023-12-17 DIAGNOSIS — M545 Low back pain, unspecified: Secondary | ICD-10-CM | POA: Diagnosis not present

## 2023-12-17 DIAGNOSIS — M47814 Spondylosis without myelopathy or radiculopathy, thoracic region: Secondary | ICD-10-CM | POA: Diagnosis not present

## 2023-12-17 DIAGNOSIS — M4815 Ankylosing hyperostosis [Forestier], thoracolumbar region: Secondary | ICD-10-CM | POA: Diagnosis not present

## 2023-12-22 ENCOUNTER — Other Ambulatory Visit: Payer: Disability Insurance

## 2023-12-23 ENCOUNTER — Other Ambulatory Visit: Payer: Self-pay | Admitting: Orthopaedic Surgery

## 2023-12-23 DIAGNOSIS — M47814 Spondylosis without myelopathy or radiculopathy, thoracic region: Secondary | ICD-10-CM

## 2023-12-23 DIAGNOSIS — M4815 Ankylosing hyperostosis [Forestier], thoracolumbar region: Secondary | ICD-10-CM

## 2023-12-23 DIAGNOSIS — M545 Low back pain, unspecified: Secondary | ICD-10-CM

## 2023-12-24 DIAGNOSIS — E538 Deficiency of other specified B group vitamins: Secondary | ICD-10-CM | POA: Diagnosis not present

## 2024-01-06 ENCOUNTER — Encounter: Payer: Self-pay | Admitting: Podiatry

## 2024-01-06 ENCOUNTER — Encounter: Payer: Self-pay | Admitting: Orthopaedic Surgery

## 2024-01-06 ENCOUNTER — Ambulatory Visit (INDEPENDENT_AMBULATORY_CARE_PROVIDER_SITE_OTHER): Payer: Medicare Other | Admitting: Podiatry

## 2024-01-06 VITALS — Ht 69.5 in | Wt 263.0 lb

## 2024-01-06 DIAGNOSIS — M79674 Pain in right toe(s): Secondary | ICD-10-CM | POA: Diagnosis not present

## 2024-01-06 DIAGNOSIS — E1142 Type 2 diabetes mellitus with diabetic polyneuropathy: Secondary | ICD-10-CM

## 2024-01-06 DIAGNOSIS — M79675 Pain in left toe(s): Secondary | ICD-10-CM | POA: Diagnosis not present

## 2024-01-06 DIAGNOSIS — B351 Tinea unguium: Secondary | ICD-10-CM | POA: Diagnosis not present

## 2024-01-06 NOTE — Progress Notes (Signed)
This patient presents to the office  for continued treatment of his fungus toenails both feet.  He has had lazer treatment and used Formula 7.  He still has continued fungus in his toenails.  He presents for evaluation and treatment.    General Appearance  Alert, conversant and in no acute stress.  Vascular  Dorsalis pedis and posterior tibial  pulses are palpable  bilaterally.  Capillary return is within normal limits  bilaterally. Temperature is within normal limits  bilaterally.  Neurologic  Senn-Weinstein monofilament wire test within normal limits  bilaterally. Muscle power within normal limits bilaterally.  Nails Thick disfigured discolored nails with subungual debris  from hallux to fifth toes bilaterally. No evidence of bacterial infection or drainage bilaterally.  Orthopedic  No limitations of motion  feet .  No crepitus or effusions noted.  No bony pathology or digital deformities noted.  Skin  normotropic skin with no porokeratosis noted bilaterally.  No signs of infections or ulcers noted.     Onychomycosis  B/l.  Debride toenails with nail nipper and dremel tool.    RTC   3 months    Helane Gunther DPM

## 2024-01-08 ENCOUNTER — Ambulatory Visit
Admission: RE | Admit: 2024-01-08 | Discharge: 2024-01-08 | Disposition: A | Discharge status: Home / Self Care | Payer: Medicare Other | Source: Ambulatory Visit | Attending: Acute Care | Admitting: Acute Care

## 2024-01-08 DIAGNOSIS — Z87891 Personal history of nicotine dependence: Secondary | ICD-10-CM

## 2024-01-08 DIAGNOSIS — F17210 Nicotine dependence, cigarettes, uncomplicated: Secondary | ICD-10-CM | POA: Diagnosis not present

## 2024-01-08 DIAGNOSIS — Z122 Encounter for screening for malignant neoplasm of respiratory organs: Secondary | ICD-10-CM

## 2024-01-08 DIAGNOSIS — F1120 Opioid dependence, uncomplicated: Secondary | ICD-10-CM | POA: Diagnosis not present

## 2024-01-11 ENCOUNTER — Ambulatory Visit
Admission: RE | Admit: 2024-01-11 | Discharge: 2024-01-11 | Disposition: A | Discharge status: Home / Self Care | Payer: Disability Insurance | Source: Ambulatory Visit | Attending: Orthopaedic Surgery | Admitting: Orthopaedic Surgery

## 2024-01-11 DIAGNOSIS — M5124 Other intervertebral disc displacement, thoracic region: Secondary | ICD-10-CM | POA: Diagnosis not present

## 2024-01-11 DIAGNOSIS — F1120 Opioid dependence, uncomplicated: Secondary | ICD-10-CM | POA: Diagnosis not present

## 2024-01-11 DIAGNOSIS — E538 Deficiency of other specified B group vitamins: Secondary | ICD-10-CM | POA: Diagnosis not present

## 2024-01-11 DIAGNOSIS — M47814 Spondylosis without myelopathy or radiculopathy, thoracic region: Secondary | ICD-10-CM

## 2024-01-11 DIAGNOSIS — M4815 Ankylosing hyperostosis [Forestier], thoracolumbar region: Secondary | ICD-10-CM

## 2024-01-11 DIAGNOSIS — R319 Hematuria, unspecified: Secondary | ICD-10-CM

## 2024-01-12 ENCOUNTER — Other Ambulatory Visit: Payer: Self-pay | Admitting: Orthopaedic Surgery

## 2024-01-12 ENCOUNTER — Other Ambulatory Visit: Payer: Self-pay

## 2024-01-12 DIAGNOSIS — M545 Low back pain, unspecified: Secondary | ICD-10-CM

## 2024-01-12 DIAGNOSIS — M4815 Ankylosing hyperostosis [Forestier], thoracolumbar region: Secondary | ICD-10-CM

## 2024-01-12 DIAGNOSIS — F17210 Nicotine dependence, cigarettes, uncomplicated: Secondary | ICD-10-CM

## 2024-01-12 DIAGNOSIS — Z122 Encounter for screening for malignant neoplasm of respiratory organs: Secondary | ICD-10-CM

## 2024-01-12 DIAGNOSIS — Z87891 Personal history of nicotine dependence: Secondary | ICD-10-CM

## 2024-01-12 DIAGNOSIS — M47814 Spondylosis without myelopathy or radiculopathy, thoracic region: Secondary | ICD-10-CM

## 2024-01-16 DIAGNOSIS — E119 Type 2 diabetes mellitus without complications: Secondary | ICD-10-CM | POA: Diagnosis not present

## 2024-01-25 DIAGNOSIS — E538 Deficiency of other specified B group vitamins: Secondary | ICD-10-CM | POA: Diagnosis not present

## 2024-01-26 ENCOUNTER — Encounter: Payer: Self-pay | Admitting: Orthopaedic Surgery

## 2024-02-05 ENCOUNTER — Other Ambulatory Visit: Payer: Medicare Other

## 2024-02-08 DIAGNOSIS — E538 Deficiency of other specified B group vitamins: Secondary | ICD-10-CM | POA: Diagnosis not present

## 2024-02-08 DIAGNOSIS — F1120 Opioid dependence, uncomplicated: Secondary | ICD-10-CM | POA: Diagnosis not present

## 2024-02-13 DIAGNOSIS — E119 Type 2 diabetes mellitus without complications: Secondary | ICD-10-CM | POA: Diagnosis not present

## 2024-02-16 ENCOUNTER — Ambulatory Visit
Admission: RE | Admit: 2024-02-16 | Discharge: 2024-02-16 | Disposition: A | Discharge status: Home / Self Care | Payer: Medicare Other | Source: Ambulatory Visit | Attending: Orthopaedic Surgery | Admitting: Orthopaedic Surgery

## 2024-02-16 DIAGNOSIS — M4815 Ankylosing hyperostosis [Forestier], thoracolumbar region: Secondary | ICD-10-CM

## 2024-02-16 DIAGNOSIS — M545 Low back pain, unspecified: Secondary | ICD-10-CM

## 2024-02-16 DIAGNOSIS — M47814 Spondylosis without myelopathy or radiculopathy, thoracic region: Secondary | ICD-10-CM

## 2024-02-17 DIAGNOSIS — E119 Type 2 diabetes mellitus without complications: Secondary | ICD-10-CM | POA: Diagnosis not present

## 2024-02-17 DIAGNOSIS — H43812 Vitreous degeneration, left eye: Secondary | ICD-10-CM | POA: Diagnosis not present

## 2024-02-17 DIAGNOSIS — H2513 Age-related nuclear cataract, bilateral: Secondary | ICD-10-CM | POA: Diagnosis not present

## 2024-02-17 DIAGNOSIS — H1045 Other chronic allergic conjunctivitis: Secondary | ICD-10-CM | POA: Diagnosis not present

## 2024-02-22 DIAGNOSIS — E538 Deficiency of other specified B group vitamins: Secondary | ICD-10-CM | POA: Diagnosis not present

## 2024-03-07 DIAGNOSIS — F1120 Opioid dependence, uncomplicated: Secondary | ICD-10-CM | POA: Diagnosis not present

## 2024-03-15 DIAGNOSIS — E119 Type 2 diabetes mellitus without complications: Secondary | ICD-10-CM | POA: Diagnosis not present

## 2024-03-17 DIAGNOSIS — M4815 Ankylosing hyperostosis [Forestier], thoracolumbar region: Secondary | ICD-10-CM | POA: Diagnosis not present

## 2024-03-17 DIAGNOSIS — Z6837 Body mass index (BMI) 37.0-37.9, adult: Secondary | ICD-10-CM | POA: Diagnosis not present

## 2024-03-17 DIAGNOSIS — M47814 Spondylosis without myelopathy or radiculopathy, thoracic region: Secondary | ICD-10-CM | POA: Diagnosis not present

## 2024-03-25 DIAGNOSIS — I10 Essential (primary) hypertension: Secondary | ICD-10-CM | POA: Diagnosis not present

## 2024-03-25 DIAGNOSIS — E782 Mixed hyperlipidemia: Secondary | ICD-10-CM | POA: Diagnosis not present

## 2024-03-25 DIAGNOSIS — E118 Type 2 diabetes mellitus with unspecified complications: Secondary | ICD-10-CM | POA: Diagnosis not present

## 2024-04-04 DIAGNOSIS — F1120 Opioid dependence, uncomplicated: Secondary | ICD-10-CM | POA: Diagnosis not present

## 2024-04-06 ENCOUNTER — Ambulatory Visit (INDEPENDENT_AMBULATORY_CARE_PROVIDER_SITE_OTHER): Payer: Medicare Other | Admitting: Podiatry

## 2024-04-06 ENCOUNTER — Encounter: Payer: Self-pay | Admitting: Podiatry

## 2024-04-06 DIAGNOSIS — M79675 Pain in left toe(s): Secondary | ICD-10-CM

## 2024-04-06 DIAGNOSIS — B351 Tinea unguium: Secondary | ICD-10-CM

## 2024-04-06 DIAGNOSIS — E1142 Type 2 diabetes mellitus with diabetic polyneuropathy: Secondary | ICD-10-CM | POA: Diagnosis not present

## 2024-04-06 DIAGNOSIS — M79674 Pain in right toe(s): Secondary | ICD-10-CM | POA: Diagnosis not present

## 2024-04-06 NOTE — Progress Notes (Signed)
 This patient presents to the office  for continued treatment of his fungus toenails both feet.  He has had lazer treatment and used Formula 7.  He still has continued fungus in his toenails.  He presents for evaluation and treatment.    General Appearance  Alert, conversant and in no acute stress.  Vascular  Dorsalis pedis and posterior tibial  pulses are palpable  bilaterally.  Capillary return is within normal limits  bilaterally. Temperature is within normal limits  bilaterally.  Neurologic  Senn-Weinstein monofilament wire test within normal limits  bilaterally. Muscle power within normal limits bilaterally.  Nails Thick disfigured discolored nails with subungual debris  from hallux to fifth toes bilaterally. No evidence of bacterial infection or drainage bilaterally.  Orthopedic  No limitations of motion  feet .  No crepitus or effusions noted.  No bony pathology or digital deformities noted.  Skin  normotropic skin with no porokeratosis noted bilaterally.  No signs of infections or ulcers noted.     Onychomycosis  B/l.  Debride toenails with nail nipper and dremel tool.    RTC   10 weeks    Ruffin Cotton DPM

## 2024-06-15 ENCOUNTER — Ambulatory Visit (INDEPENDENT_AMBULATORY_CARE_PROVIDER_SITE_OTHER): Admitting: Podiatry

## 2024-06-15 DIAGNOSIS — B351 Tinea unguium: Secondary | ICD-10-CM

## 2024-06-15 DIAGNOSIS — E1142 Type 2 diabetes mellitus with diabetic polyneuropathy: Secondary | ICD-10-CM

## 2024-06-15 DIAGNOSIS — M79674 Pain in right toe(s): Secondary | ICD-10-CM

## 2024-06-15 DIAGNOSIS — M79675 Pain in left toe(s): Secondary | ICD-10-CM

## 2024-06-15 NOTE — Progress Notes (Signed)
 This patient presents to the office  for continued treatment of his fungus toenails both feet.  He has had lazer treatment and used Formula 7.  He still has continued fungus in his toenails.  He presents for evaluation and treatment.    General Appearance  Alert, conversant and in no acute stress.  Vascular  Dorsalis pedis and posterior tibial  pulses are palpable  bilaterally.  Capillary return is within normal limits  bilaterally. Temperature is within normal limits  bilaterally.  Neurologic  Senn-Weinstein monofilament wire test within normal limits  bilaterally. Muscle power within normal limits bilaterally.  Nails Thick disfigured discolored nails with subungual debris  from hallux to fifth toes bilaterally. No evidence of bacterial infection or drainage bilaterally.  Orthopedic  No limitations of motion  feet .  No crepitus or effusions noted.  No bony pathology or digital deformities noted.  Skin  normotropic skin with no porokeratosis noted bilaterally.  No signs of infections or ulcers noted.     Onychomycosis  B/l.  Debride toenails with nail nipper and dremel tool.    RTC   10 weeks    Ruffin Cotton DPM

## 2024-06-28 DIAGNOSIS — E118 Type 2 diabetes mellitus with unspecified complications: Secondary | ICD-10-CM | POA: Diagnosis not present

## 2024-06-28 DIAGNOSIS — E538 Deficiency of other specified B group vitamins: Secondary | ICD-10-CM | POA: Diagnosis not present

## 2024-06-28 DIAGNOSIS — I700 Atherosclerosis of aorta: Secondary | ICD-10-CM | POA: Diagnosis not present

## 2024-06-28 DIAGNOSIS — E782 Mixed hyperlipidemia: Secondary | ICD-10-CM | POA: Diagnosis not present

## 2024-08-24 ENCOUNTER — Ambulatory Visit: Admitting: Podiatry

## 2024-08-25 DIAGNOSIS — Z1212 Encounter for screening for malignant neoplasm of rectum: Secondary | ICD-10-CM | POA: Diagnosis not present

## 2024-08-25 DIAGNOSIS — Z1211 Encounter for screening for malignant neoplasm of colon: Secondary | ICD-10-CM | POA: Diagnosis not present

## 2024-09-06 ENCOUNTER — Encounter: Payer: Self-pay | Admitting: Podiatry

## 2024-09-06 ENCOUNTER — Ambulatory Visit (INDEPENDENT_AMBULATORY_CARE_PROVIDER_SITE_OTHER): Admitting: Podiatry

## 2024-09-06 DIAGNOSIS — M79675 Pain in left toe(s): Secondary | ICD-10-CM

## 2024-09-06 DIAGNOSIS — B351 Tinea unguium: Secondary | ICD-10-CM | POA: Diagnosis not present

## 2024-09-06 DIAGNOSIS — E1142 Type 2 diabetes mellitus with diabetic polyneuropathy: Secondary | ICD-10-CM | POA: Diagnosis not present

## 2024-09-06 DIAGNOSIS — M79674 Pain in right toe(s): Secondary | ICD-10-CM

## 2024-09-06 NOTE — Progress Notes (Signed)
 This patient presents to the office  for continued treatment of his fungus toenails both feet.  He has had lazer treatment and used Formula 7.  He still has continued fungus in his toenails.  He presents for evaluation and treatment.    General Appearance  Alert, conversant and in no acute stress.  Vascular  Dorsalis pedis and posterior tibial  pulses are palpable  bilaterally.  Capillary return is within normal limits  bilaterally. Temperature is within normal limits  bilaterally.  Neurologic  Senn-Weinstein monofilament wire test within normal limits  bilaterally. Muscle power within normal limits bilaterally.  Nails Thick disfigured discolored nails with subungual debris  from hallux to fifth toes bilaterally. No evidence of bacterial infection or drainage bilaterally.  Orthopedic  No limitations of motion  feet .  No crepitus or effusions noted.  No bony pathology or digital deformities noted.  Skin  normotropic skin with no porokeratosis noted bilaterally.  No signs of infections or ulcers noted.     Onychomycosis  B/l.  Debride toenails with nail nipper and dremel tool.    RTC   10 weeks    Ruffin Cotton DPM

## 2024-09-30 DIAGNOSIS — E118 Type 2 diabetes mellitus with unspecified complications: Secondary | ICD-10-CM | POA: Diagnosis not present

## 2024-09-30 DIAGNOSIS — E782 Mixed hyperlipidemia: Secondary | ICD-10-CM | POA: Diagnosis not present

## 2024-11-16 ENCOUNTER — Ambulatory Visit: Admitting: Podiatry

## 2024-11-16 ENCOUNTER — Encounter: Payer: Self-pay | Admitting: Podiatry

## 2024-11-16 DIAGNOSIS — M79675 Pain in left toe(s): Secondary | ICD-10-CM

## 2024-11-16 DIAGNOSIS — B351 Tinea unguium: Secondary | ICD-10-CM

## 2024-11-16 DIAGNOSIS — E1142 Type 2 diabetes mellitus with diabetic polyneuropathy: Secondary | ICD-10-CM | POA: Diagnosis not present

## 2024-11-16 DIAGNOSIS — M79674 Pain in right toe(s): Secondary | ICD-10-CM

## 2024-11-16 NOTE — Progress Notes (Signed)
 This patient presents to the office  for continued treatment of his fungus toenails both feet.  He has had lazer treatment and used Formula 7.  He still has continued fungus in his toenails.  He presents for evaluation and treatment.    General Appearance  Alert, conversant and in no acute stress.  Vascular  Dorsalis pedis and posterior tibial  pulses are palpable  bilaterally.  Capillary return is within normal limits  bilaterally. Temperature is within normal limits  bilaterally.  Neurologic  Senn-Weinstein monofilament wire test within normal limits  bilaterally. Muscle power within normal limits bilaterally.  Nails Thick disfigured discolored nails with subungual debris  from hallux to fifth toes bilaterally. No evidence of bacterial infection or drainage bilaterally.  Orthopedic  No limitations of motion  feet .  No crepitus or effusions noted.  No bony pathology or digital deformities noted.  Skin  normotropic skin with no porokeratosis noted bilaterally.  No signs of infections or ulcers noted.     Onychomycosis  B/l.  Debride toenails with nail nipper and dremel tool.    RTC   10 weeks    Ruffin Cotton DPM

## 2024-12-02 ENCOUNTER — Encounter: Payer: Self-pay | Admitting: Acute Care

## 2025-01-04 ENCOUNTER — Other Ambulatory Visit: Payer: Self-pay

## 2025-01-04 DIAGNOSIS — Z87891 Personal history of nicotine dependence: Secondary | ICD-10-CM

## 2025-01-04 DIAGNOSIS — F17210 Nicotine dependence, cigarettes, uncomplicated: Secondary | ICD-10-CM

## 2025-01-04 DIAGNOSIS — Z122 Encounter for screening for malignant neoplasm of respiratory organs: Secondary | ICD-10-CM

## 2025-01-09 ENCOUNTER — Ambulatory Visit: Admitting: Podiatry

## 2025-01-17 ENCOUNTER — Other Ambulatory Visit

## 2025-01-25 ENCOUNTER — Ambulatory Visit: Admitting: Podiatry

## 2025-01-26 ENCOUNTER — Other Ambulatory Visit
